# Patient Record
Sex: Female | Born: 1945 | Race: White | Hispanic: No | Smoking: Never smoker
Health system: Southern US, Community
[De-identification: ages and names within clinical notes are randomized; demographics above are authoritative.]

## PROBLEM LIST (undated history)

## (undated) DIAGNOSIS — E079 Disorder of thyroid, unspecified: Secondary | ICD-10-CM

## (undated) DIAGNOSIS — N893 Dysplasia of vagina, unspecified: Secondary | ICD-10-CM

## (undated) DIAGNOSIS — R000 Tachycardia, unspecified: Secondary | ICD-10-CM

## (undated) DIAGNOSIS — I341 Nonrheumatic mitral (valve) prolapse: Secondary | ICD-10-CM

## (undated) DIAGNOSIS — E162 Hypoglycemia, unspecified: Secondary | ICD-10-CM

## (undated) DIAGNOSIS — N952 Postmenopausal atrophic vaginitis: Secondary | ICD-10-CM

## (undated) HISTORY — DX: Tachycardia, unspecified: R00.0

## (undated) HISTORY — DX: Nonrheumatic mitral (valve) prolapse: I34.1

## (undated) HISTORY — DX: Hypoglycemia, unspecified: E16.2

## (undated) HISTORY — PX: ELBOW SURGERY: SHX618

## (undated) HISTORY — DX: Postmenopausal atrophic vaginitis: N95.2

## (undated) HISTORY — PX: OOPHORECTOMY: SHX86

## (undated) HISTORY — DX: Disorder of thyroid, unspecified: E07.9

## (undated) HISTORY — PX: COLPOSCOPY: SHX161

## (undated) HISTORY — DX: Dysplasia of vagina, unspecified: N89.3

## (undated) HISTORY — PX: OTHER SURGICAL HISTORY: SHX169

---

## 1982-11-02 HISTORY — PX: ABDOMINAL HYSTERECTOMY: SHX81

## 1992-11-02 DIAGNOSIS — N893 Dysplasia of vagina, unspecified: Secondary | ICD-10-CM

## 1992-11-02 HISTORY — DX: Dysplasia of vagina, unspecified: N89.3

## 2000-11-02 HISTORY — PX: OTHER SURGICAL HISTORY: SHX169

## 2004-02-26 ENCOUNTER — Other Ambulatory Visit: Admission: RE | Admit: 2004-02-26 | Discharge: 2004-02-26 | Payer: Self-pay | Admitting: Obstetrics and Gynecology

## 2004-03-13 ENCOUNTER — Ambulatory Visit (HOSPITAL_COMMUNITY): Admission: RE | Admit: 2004-03-13 | Discharge: 2004-03-13 | Payer: Self-pay | Admitting: Obstetrics and Gynecology

## 2004-08-14 ENCOUNTER — Other Ambulatory Visit: Admission: RE | Admit: 2004-08-14 | Discharge: 2004-08-14 | Payer: Self-pay | Admitting: Obstetrics and Gynecology

## 2004-11-02 HISTORY — PX: OTHER SURGICAL HISTORY: SHX169

## 2005-03-12 ENCOUNTER — Other Ambulatory Visit: Admission: RE | Admit: 2005-03-12 | Discharge: 2005-03-12 | Payer: Self-pay | Admitting: Addiction Medicine

## 2005-06-21 ENCOUNTER — Emergency Department (HOSPITAL_COMMUNITY): Admission: EM | Admit: 2005-06-21 | Discharge: 2005-06-21 | Payer: Self-pay | Admitting: *Deleted

## 2005-10-16 ENCOUNTER — Ambulatory Visit (HOSPITAL_BASED_OUTPATIENT_CLINIC_OR_DEPARTMENT_OTHER): Admission: RE | Admit: 2005-10-16 | Discharge: 2005-10-16 | Payer: Self-pay | Admitting: Urology

## 2005-10-16 ENCOUNTER — Ambulatory Visit (HOSPITAL_COMMUNITY): Admission: RE | Admit: 2005-10-16 | Discharge: 2005-10-16 | Payer: Self-pay | Admitting: Urology

## 2006-04-06 ENCOUNTER — Other Ambulatory Visit: Admission: RE | Admit: 2006-04-06 | Discharge: 2006-04-06 | Payer: Self-pay | Admitting: Obstetrics and Gynecology

## 2006-08-05 ENCOUNTER — Ambulatory Visit (HOSPITAL_BASED_OUTPATIENT_CLINIC_OR_DEPARTMENT_OTHER): Admission: RE | Admit: 2006-08-05 | Discharge: 2006-08-05 | Payer: Self-pay | Admitting: Urology

## 2007-04-11 ENCOUNTER — Other Ambulatory Visit: Admission: RE | Admit: 2007-04-11 | Discharge: 2007-04-11 | Payer: Self-pay | Admitting: Obstetrics and Gynecology

## 2007-04-18 ENCOUNTER — Encounter (INDEPENDENT_AMBULATORY_CARE_PROVIDER_SITE_OTHER): Payer: Self-pay | Admitting: *Deleted

## 2007-04-18 ENCOUNTER — Ambulatory Visit (HOSPITAL_COMMUNITY): Admission: RE | Admit: 2007-04-18 | Discharge: 2007-04-18 | Payer: Self-pay | Admitting: *Deleted

## 2008-04-30 ENCOUNTER — Ambulatory Visit (HOSPITAL_COMMUNITY): Admission: RE | Admit: 2008-04-30 | Discharge: 2008-04-30 | Payer: Self-pay | Admitting: Obstetrics and Gynecology

## 2008-05-08 ENCOUNTER — Other Ambulatory Visit: Admission: RE | Admit: 2008-05-08 | Discharge: 2008-05-08 | Payer: Self-pay | Admitting: Obstetrics and Gynecology

## 2008-10-19 ENCOUNTER — Ambulatory Visit: Payer: Self-pay | Admitting: Obstetrics and Gynecology

## 2008-10-19 ENCOUNTER — Other Ambulatory Visit: Admission: RE | Admit: 2008-10-19 | Discharge: 2008-10-19 | Payer: Self-pay | Admitting: Obstetrics and Gynecology

## 2008-11-02 HISTORY — PX: CATARACT EXTRACTION: SUR2

## 2009-07-11 ENCOUNTER — Other Ambulatory Visit: Admission: RE | Admit: 2009-07-11 | Discharge: 2009-07-11 | Payer: Self-pay | Admitting: Obstetrics and Gynecology

## 2009-07-11 ENCOUNTER — Ambulatory Visit: Payer: Self-pay | Admitting: Obstetrics and Gynecology

## 2009-07-11 ENCOUNTER — Encounter: Payer: Self-pay | Admitting: Obstetrics and Gynecology

## 2009-07-23 ENCOUNTER — Ambulatory Visit (HOSPITAL_COMMUNITY): Admission: RE | Admit: 2009-07-23 | Discharge: 2009-07-23 | Payer: Self-pay | Admitting: Obstetrics and Gynecology

## 2009-07-23 ENCOUNTER — Ambulatory Visit: Payer: Self-pay | Admitting: Obstetrics and Gynecology

## 2009-07-25 ENCOUNTER — Ambulatory Visit: Payer: Self-pay | Admitting: Obstetrics and Gynecology

## 2009-08-06 ENCOUNTER — Ambulatory Visit: Payer: Self-pay | Admitting: Obstetrics and Gynecology

## 2009-08-22 ENCOUNTER — Ambulatory Visit (HOSPITAL_COMMUNITY): Admission: RE | Admit: 2009-08-22 | Discharge: 2009-08-22 | Payer: Self-pay | Admitting: Obstetrics and Gynecology

## 2009-09-20 ENCOUNTER — Encounter: Admission: RE | Admit: 2009-09-20 | Discharge: 2009-09-20 | Payer: Self-pay | Admitting: Gastroenterology

## 2009-11-02 HISTORY — PX: EYE SURGERY: SHX253

## 2010-01-22 ENCOUNTER — Other Ambulatory Visit: Admission: RE | Admit: 2010-01-22 | Discharge: 2010-01-22 | Payer: Self-pay | Admitting: Obstetrics and Gynecology

## 2010-01-22 ENCOUNTER — Ambulatory Visit: Payer: Self-pay | Admitting: Obstetrics and Gynecology

## 2010-03-03 IMAGING — RF DG ESOPHAGUS
10 series · 10 of 10 positions shown · non-contrast
Comparison: None

CLINICAL DATA: Dysphagia, feeling of food sticking in the mid
esophagus

ESOPHOGRAM/BARIUM SWALLOW
TECHNIQUE: Combined double contrast and single contrast
examination performed using effervescent crystals, thick barium
liquid, and thin barium liquid.
Fluoroscopy time:  2.6 minutes.

[Series 1: run · 1 of 1 slices shown (1 of 10)]
[im 1/1]
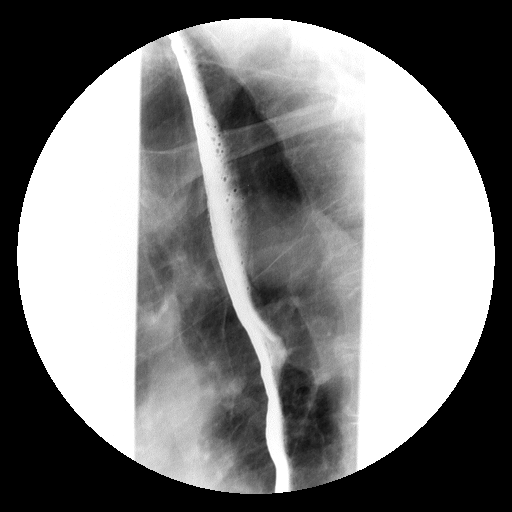

[Series 2: run · 1 of 1 slices shown (2 of 10)]
[im 1/1]
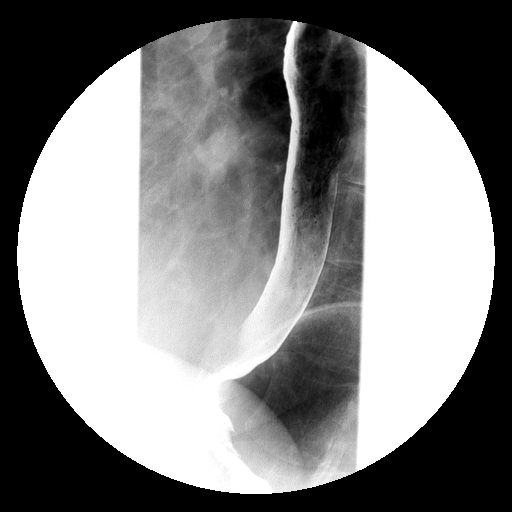

[Series 3: run · 1 of 1 slices shown (3 of 10)]
[im 1/1]
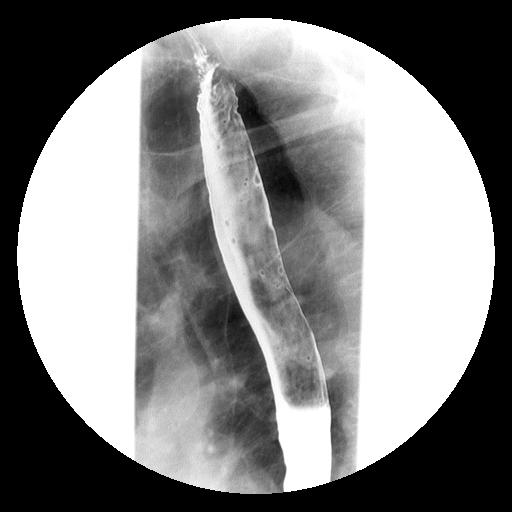

[Series 4: run · 1 of 1 slices shown (4 of 10)]
[im 1/1]
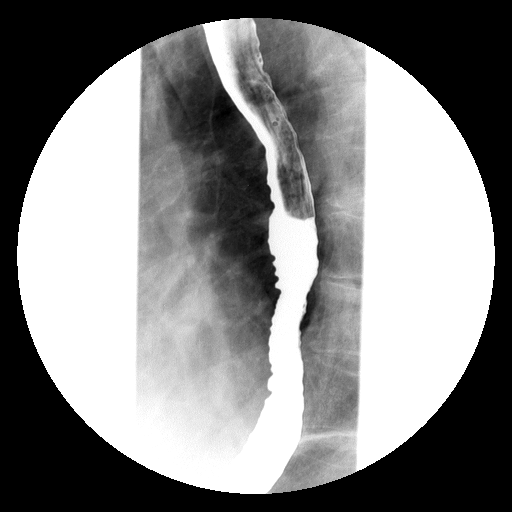

[Series 5: run · 1 of 1 slices shown (5 of 10)]
[im 1/1]
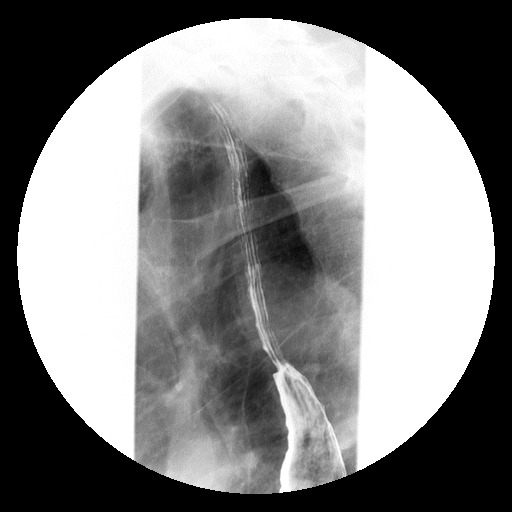

[Series 6: run · 1 of 1 slices shown (6 of 10)]
[im 1/1]
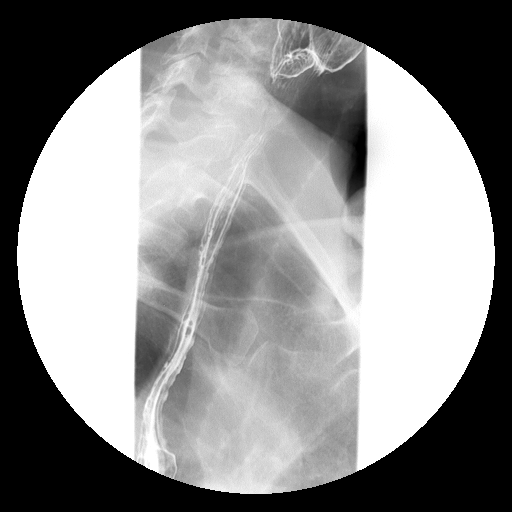

[Series 7: run · 1 of 1 slices shown (7 of 10)]
[im 1/1]
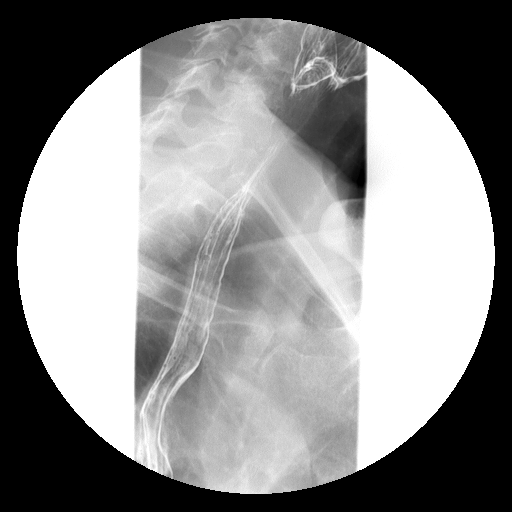

[Series 8: run · 1 of 1 slices shown (8 of 10)]
[im 1/1]
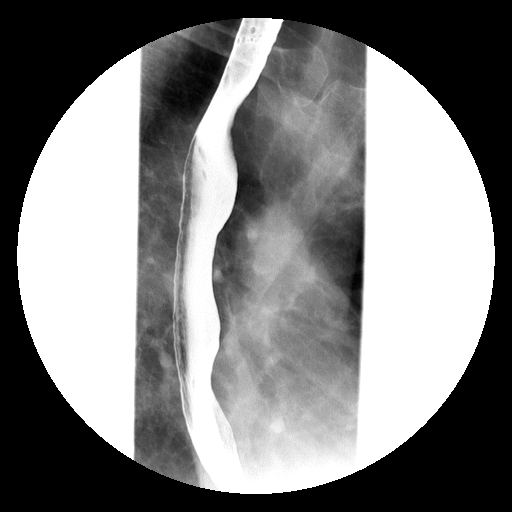

[Series 9: run · 1 of 1 slices shown (9 of 10)]
[im 1/1]
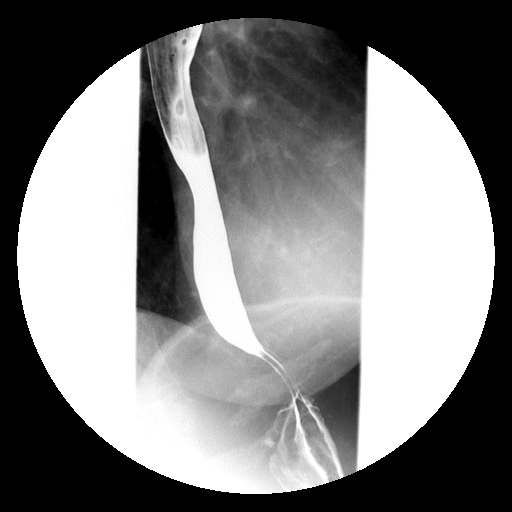

[Series 10: run · 1 of 1 slices shown (10 of 10)]
[im 1/1]
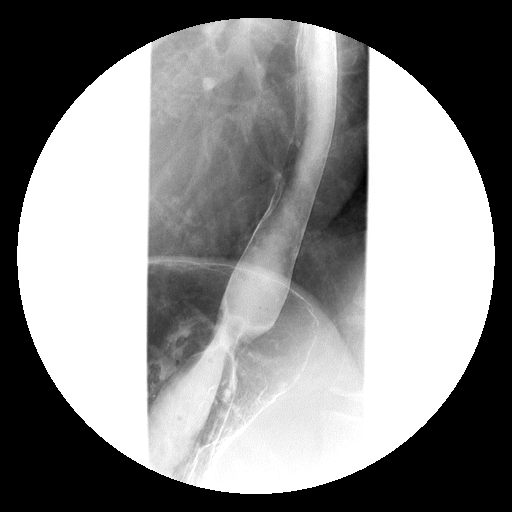

[10 of 10 positions shown; findings below may reference images not displayed]

FINDINGS: No mucosal irregularity within the thoracic esophagus,
distal esophagus, or gastroesophageal junction.  No evidence of
stricture or mass. With the patient in the prone position,
esophageal motility was assessed.  There was escape of the barium
bolus from a primary stripping wave and mild tertiary contractions.
This results in some to-and-fro flow of barium bolus within the
esophagus.

No gastroesophageal reflux is noted during exam.  No hiatal hernia.
A 13 mm barium tablet passed GE junction easily.
IMPRESSION: 1.  No evidence of esophageal mucosal irregularity, stricture, or
mass.
2.  Mild esophageal dysmotility most consistent with
presbyesophagus.

## 2010-08-18 ENCOUNTER — Ambulatory Visit: Payer: Self-pay | Admitting: Obstetrics and Gynecology

## 2010-08-18 ENCOUNTER — Other Ambulatory Visit: Admission: RE | Admit: 2010-08-18 | Discharge: 2010-08-18 | Payer: Self-pay | Admitting: Obstetrics and Gynecology

## 2010-08-25 ENCOUNTER — Ambulatory Visit (HOSPITAL_COMMUNITY): Admission: RE | Admit: 2010-08-25 | Discharge: 2010-08-25 | Payer: Self-pay | Admitting: Obstetrics and Gynecology

## 2011-03-17 NOTE — Op Note (Signed)
NAMELILIA, LETTERMAN               ACCOUNT NO.:  [id number]   MEDICAL RECORD NO.:  [id number]          PATIENT TYPE:  AMB   LOCATION:  DAY                          FACILITY:  [facility]   PHYSICIAN:  Alfonse Ras, MD   DATE OF BIRTH:  Jul 20, 1946   DATE OF PROCEDURE:  04/18/2007  DATE OF DISCHARGE:                               OPERATIVE REPORT   PREOPERATIVE DIAGNOSIS:  Left abdominal wall mass.   POSTOPERATIVE DIAGNOSIS:  Left abdominal wall mass.   PROCEDURE:  Excision of left subcutaneous abdominal wall mass.   ANESTHESIA:  Local MAC.   SURGEON:  Baruch Merl, M.D.   DESCRIPTION:  The patient was taken to the operating room, placed in the  supine position.  After adequate MAC anesthesia was induced, she was  rolled into the right lateral decubitus position.  The left upper  quadrant was prepped and draped in normal sterile fashion.  A  subcutaneous injection of 1% lidocaine with epinephrine was done.  An  elliptical incision was made around the palpable mass.  I dissected down  onto an easily removal and encapsulated lipoma.  This was removed.  The  cavity was coagulated and adequate hemostasis was ensured.  The skin was  closed with a subcuticular #4-0 Monocryl.  Steri-Strips, sterile  dressings were applied.  Patient tolerated the procedure well and went  to PACU in good condition.      Alfonse Ras, MD  Electronically Signed     KRE/MEDQ  D:  04/18/2007  T:  04/18/2007  Job:  [id number]

## 2011-03-20 NOTE — Op Note (Signed)
NAMEJAQUELYN, SAKAMOTO               ACCOUNT NO.:  [id number]   MEDICAL RECORD NO.:  [id number]          PATIENT TYPE:  AMB   LOCATION:  [facility]                         FACILITY:  [facility]   PHYSICIAN:  Martina Sinner, MD DATE OF BIRTH:  Feb 12, 1946   DATE OF PROCEDURE:  08/05/2006  DATE OF DISCHARGE:                                 OPERATIVE REPORT   PREOPERATIVE DIAGNOSIS:  Stress incontinence.   POSTOPERATIVE DIAGNOSIS:  Stress incontinence.   PROCEDURE:  Cystoscopy, transurethral collagen injection therapy.   Ms. Ausley has stress urinary continence.  She was continent after one  collagen.  She has had a previous sling.   The patient was prepped and draped in the usual fashion.  The collagen scope  was used for the examination.  The bladder mucosa and trigone were normal.  I injected 2 syringes at 5 and 7 o'clock.  The 7 o'clock injection worked  Agricultural consultant.  She had excellent coaptation.   The bladder was emptied and she was sent to the recovery room.  She will be  followed as per protocol.           ______________________________  Martina Sinner, MD  Electronically Signed     SAM/MEDQ  D:  08/05/2006  T:  08/07/2006  Job:  [id number]

## 2011-03-20 NOTE — Op Note (Signed)
NAME:  Morgan Wilkinson, Morgan Wilkinson               ACCOUNT NO.:  [id number]   MEDICAL RECORD NO.:  [id number]          PATIENT TYPE:  AMB   LOCATION:  [facility]                         FACILITY:  [facility]   PHYSICIAN:  Martina Sinner, MD DATE OF BIRTH:  Apr 09, 1946   DATE OF PROCEDURE:  DATE OF DISCHARGE:                                 OPERATIVE REPORT   PREOPERATIVE DIAGNOSIS:  Stress incontinence.   POSTOPERATIVE DIAGNOSIS:  Stress incontinence.   SURGERY:  Cystoscopy, transurethral collagen injection therapy.   Ms. Trombetta has stress incontinence and mild urge incontinence.  She is on  Vesicare.  She has had a SPARC sling a few years ago.  She has minimal  hypermobility.   She was prepped and draped in the usual fashion.  The 24 [place] injection  scope was utilized for the cystoscopy and for the injection.  The bladder  mucosa and trigone were normal.  There was no stitch, foreign body, or  carcinoma.  She has a nice healthy, long urethra.  I injected up to 5 and 7  o'clock and had excellent coaptation with two syringes of collagen.  Hopefully, this will greatly improve her continence status.   Her bladder was emptied partially with a 12 French red rubber catheter at  the end of the case.  She was covered perioperatively with antibiotics.  She  will be followed as per protocol.           ______________________________  Martina Sinner, MD  Electronically Signed     SAM/MEDQ  D:  10/16/2005  T:  10/16/2005  Job:  [id number]

## 2011-08-19 LAB — HEMOGLOBIN AND HEMATOCRIT, BLOOD: Hemoglobin: 13.9

## 2011-09-01 ENCOUNTER — Encounter: Payer: Self-pay | Admitting: *Deleted

## 2011-09-01 NOTE — Progress Notes (Signed)
  Lm for patient to call on 08/19/11 and 08/25/11.  Told patient she was due for Reclast and if we were still doing it for her to call.  She had a records release in the chart, but wasn't sure if the patient had left the practice.  Had no return call.  Filed chart back.

## 2011-09-04 ENCOUNTER — Other Ambulatory Visit: Payer: Self-pay | Admitting: Obstetrics and Gynecology

## 2012-02-02 DIAGNOSIS — R000 Tachycardia, unspecified: Secondary | ICD-10-CM | POA: Insufficient documentation

## 2012-02-02 DIAGNOSIS — N809 Endometriosis, unspecified: Secondary | ICD-10-CM | POA: Insufficient documentation

## 2012-02-02 DIAGNOSIS — N952 Postmenopausal atrophic vaginitis: Secondary | ICD-10-CM | POA: Insufficient documentation

## 2012-02-02 DIAGNOSIS — N893 Dysplasia of vagina, unspecified: Secondary | ICD-10-CM | POA: Insufficient documentation

## 2012-02-02 DIAGNOSIS — I341 Nonrheumatic mitral (valve) prolapse: Secondary | ICD-10-CM | POA: Insufficient documentation

## 2012-02-03 ENCOUNTER — Encounter: Payer: Self-pay | Admitting: Obstetrics and Gynecology

## 2012-02-03 ENCOUNTER — Ambulatory Visit (INDEPENDENT_AMBULATORY_CARE_PROVIDER_SITE_OTHER): Payer: Medicare Other | Admitting: Obstetrics and Gynecology

## 2012-02-03 VITALS — BP 138/86 | Ht 64.0 in | Wt 138.0 lb

## 2012-02-03 DIAGNOSIS — N898 Other specified noninflammatory disorders of vagina: Secondary | ICD-10-CM | POA: Diagnosis not present

## 2012-02-03 DIAGNOSIS — N952 Postmenopausal atrophic vaginitis: Secondary | ICD-10-CM

## 2012-02-03 DIAGNOSIS — R350 Frequency of micturition: Secondary | ICD-10-CM

## 2012-02-03 DIAGNOSIS — E079 Disorder of thyroid, unspecified: Secondary | ICD-10-CM | POA: Insufficient documentation

## 2012-02-03 DIAGNOSIS — M810 Age-related osteoporosis without current pathological fracture: Secondary | ICD-10-CM | POA: Insufficient documentation

## 2012-02-03 DIAGNOSIS — N951 Menopausal and female climacteric states: Secondary | ICD-10-CM | POA: Diagnosis not present

## 2012-02-03 DIAGNOSIS — R32 Unspecified urinary incontinence: Secondary | ICD-10-CM | POA: Insufficient documentation

## 2012-02-03 DIAGNOSIS — E162 Hypoglycemia, unspecified: Secondary | ICD-10-CM | POA: Insufficient documentation

## 2012-02-03 DIAGNOSIS — Z780 Asymptomatic menopausal state: Secondary | ICD-10-CM

## 2012-02-03 DIAGNOSIS — J019 Acute sinusitis, unspecified: Secondary | ICD-10-CM

## 2012-02-03 DIAGNOSIS — N893 Dysplasia of vagina, unspecified: Secondary | ICD-10-CM

## 2012-02-03 MED ORDER — AZITHROMYCIN 250 MG PO TABS: ORAL_TABLET | ORAL | Status: AC

## 2012-02-03 MED ORDER — ESTRADIOL 1 MG PO TABS: 2.0000 mg | ORAL_TABLET | Freq: Every day | ORAL | Status: DC

## 2012-02-03 NOTE — Progress Notes (Signed)
Patient came to see me today for further followup. The first thing we discussed is her osteoporosis. She has had two-years of IV Reclast but skipped last year. She has had no fractures. She is ready to proceed with her third year of IV Reclast. 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 She has however now had 3 years of normal Pap smears. She is having no vaginal bleeding. She is having no pelvic pain. She stopped her HRT 2 months ago and after one month of feeling normal is having severe hot flashes and vaginal dryness. She was previously on 1-1/2 mg of estradiol would like to start higher initially to control symptoms. We have discussed estrogen patch which she does not want to do you know she understands the increased risk of DVT with oral estrogen. She also has a sinus infection today with severe sinus headache and purulent discharge and asked me to treat her.  ROS: 12 sister reviewed done. Pertinent positives above. Other positives include tachycardia with mitral valve prolapse, hypoglycemia, and  Hypothyroidism.  HEENT: Within normal limits. Kennon Portela present. Neck: No masses. Supraclavicular lymph nodes: Not enlarged. Breasts: Examined in both sitting and lying position. Symmetrical without skin changes or masses. Abdomen: Soft no masses guarding or rebound. No hernias. Pelvic: External within normal limits. BUS within normal limits. Vaginal examination shows good estrogen effect, no cystocele enterocele or rectocele. Cervix and uterus absent. Adnexa within normal limits. Rectovaginal confirmatory. Extremities within normal limits.  Assessment: Menopausal symptoms. Atrophic vaginitis. Osteoporosis. Sinusitis.  Plan: We started patient on estradiol 2 mg daily. Patient declined estrogen patch. When she is feeling better we will reduce the dose to 1-1/2 mg daily. We will get her approved for IV Reclast. She will continue yearly mammograms. She was prescribed a Z-Pak for her  sinusitis.

## 2012-02-04 DIAGNOSIS — R351 Nocturia: Secondary | ICD-10-CM | POA: Diagnosis not present

## 2012-02-04 DIAGNOSIS — R350 Frequency of micturition: Secondary | ICD-10-CM | POA: Diagnosis not present

## 2012-02-04 DIAGNOSIS — R3915 Urgency of urination: Secondary | ICD-10-CM | POA: Diagnosis not present

## 2012-02-04 LAB — URINALYSIS W MICROSCOPIC + REFLEX CULTURE
Bacteria, UA: NONE SEEN
Bilirubin Urine: NEGATIVE
Crystals: NONE SEEN
Hgb urine dipstick: NEGATIVE
Ketones, ur: NEGATIVE mg/dL
Protein, ur: NEGATIVE mg/dL
Urobilinogen, UA: 0.2 mg/dL (ref 0.0–1.0)

## 2012-02-05 LAB — URINE CULTURE: Colony Count: NO GROWTH

## 2012-02-09 ENCOUNTER — Telehealth: Payer: Self-pay | Admitting: *Deleted

## 2012-02-09 NOTE — Telephone Encounter (Signed)
Message copied by Valeda Malm L on Tue Feb 09, 2012  4:39 PM ------      Message from: Trellis Paganini      Created: Wed Feb 03, 2012  4:52 PM       Please get patient approved for IV Reclast. Diagnosis is osteoporosis.

## 2012-02-09 NOTE — Telephone Encounter (Signed)
Lm for patient to call about benefits for Reclast.

## 2012-02-12 ENCOUNTER — Other Ambulatory Visit: Payer: Self-pay | Admitting: *Deleted

## 2012-02-12 DIAGNOSIS — M898X9 Other specified disorders of bone, unspecified site: Secondary | ICD-10-CM

## 2012-02-12 NOTE — Telephone Encounter (Signed)
Patient informed benefits her part would be approx $347.  Patient wants to proceed.  Will come for labs on Monday.  Orders in pc.

## 2012-02-15 ENCOUNTER — Other Ambulatory Visit: Payer: Medicare Other

## 2012-02-15 DIAGNOSIS — M948X9 Other specified disorders of cartilage, unspecified sites: Secondary | ICD-10-CM | POA: Diagnosis not present

## 2012-02-15 DIAGNOSIS — M898X9 Other specified disorders of bone, unspecified site: Secondary | ICD-10-CM

## 2012-02-15 LAB — CALCIUM: Calcium: 9.7 mg/dL (ref 8.4–10.5)

## 2012-02-17 NOTE — Telephone Encounter (Signed)
Patient informed labs wnl.  Reclast set up for 02/26/12 @ 8am.  Instruction sheet mailed to patient.

## 2012-02-24 ENCOUNTER — Other Ambulatory Visit (HOSPITAL_COMMUNITY): Payer: Self-pay | Admitting: *Deleted

## 2012-02-25 DIAGNOSIS — E039 Hypothyroidism, unspecified: Secondary | ICD-10-CM | POA: Diagnosis not present

## 2012-02-25 DIAGNOSIS — Z79899 Other long term (current) drug therapy: Secondary | ICD-10-CM | POA: Diagnosis not present

## 2012-02-25 DIAGNOSIS — J309 Allergic rhinitis, unspecified: Secondary | ICD-10-CM | POA: Diagnosis not present

## 2012-02-25 DIAGNOSIS — H1045 Other chronic allergic conjunctivitis: Secondary | ICD-10-CM | POA: Diagnosis not present

## 2012-02-26 ENCOUNTER — Encounter (HOSPITAL_COMMUNITY)
Admission: RE | Admit: 2012-02-26 | Discharge: 2012-02-26 | Disposition: A | Discharge status: Home / Self Care | Payer: Medicare Other | Source: Ambulatory Visit | Attending: Obstetrics and Gynecology | Admitting: Obstetrics and Gynecology

## 2012-02-26 DIAGNOSIS — M810 Age-related osteoporosis without current pathological fracture: Secondary | ICD-10-CM | POA: Diagnosis not present

## 2012-02-26 MED ORDER — ZOLEDRONIC ACID 5 MG/100ML IV SOLN
5.0000 mg | Freq: Once | INTRAVENOUS | Status: AC
  Administered 2012-02-26: 5 mg via INTRAVENOUS
  Filled 2012-02-26: qty 100

## 2012-03-10 DIAGNOSIS — K2100 Gastro-esophageal reflux disease with esophagitis, without bleeding: Secondary | ICD-10-CM | POA: Diagnosis not present

## 2012-03-10 DIAGNOSIS — E039 Hypothyroidism, unspecified: Secondary | ICD-10-CM | POA: Diagnosis not present

## 2012-03-21 DIAGNOSIS — R3915 Urgency of urination: Secondary | ICD-10-CM | POA: Diagnosis not present

## 2012-03-21 DIAGNOSIS — R351 Nocturia: Secondary | ICD-10-CM | POA: Diagnosis not present

## 2012-04-05 ENCOUNTER — Telehealth: Payer: Self-pay | Admitting: *Deleted

## 2012-04-05 ENCOUNTER — Other Ambulatory Visit: Payer: Self-pay | Admitting: Family Medicine

## 2012-04-05 DIAGNOSIS — H6120 Impacted cerumen, unspecified ear: Secondary | ICD-10-CM | POA: Diagnosis not present

## 2012-04-05 DIAGNOSIS — Z0000 Encounter for general adult medical examination without abnormal findings: Secondary | ICD-10-CM | POA: Diagnosis not present

## 2012-04-05 DIAGNOSIS — J309 Allergic rhinitis, unspecified: Secondary | ICD-10-CM | POA: Diagnosis not present

## 2012-04-05 DIAGNOSIS — G4700 Insomnia, unspecified: Secondary | ICD-10-CM | POA: Diagnosis not present

## 2012-04-05 DIAGNOSIS — E039 Hypothyroidism, unspecified: Secondary | ICD-10-CM | POA: Diagnosis not present

## 2012-04-05 DIAGNOSIS — F411 Generalized anxiety disorder: Secondary | ICD-10-CM | POA: Diagnosis not present

## 2012-04-05 DIAGNOSIS — Z136 Encounter for screening for cardiovascular disorders: Secondary | ICD-10-CM | POA: Diagnosis not present

## 2012-04-05 DIAGNOSIS — Z79899 Other long term (current) drug therapy: Secondary | ICD-10-CM | POA: Diagnosis not present

## 2012-04-05 DIAGNOSIS — M810 Age-related osteoporosis without current pathological fracture: Secondary | ICD-10-CM | POA: Diagnosis not present

## 2012-04-05 DIAGNOSIS — Z1231 Encounter for screening mammogram for malignant neoplasm of breast: Secondary | ICD-10-CM

## 2012-04-05 MED ORDER — ESTRADIOL 1 MG PO TABS: 2.0000 mg | ORAL_TABLET | Freq: Every day | ORAL | Status: DC

## 2012-04-05 NOTE — Telephone Encounter (Signed)
Pt calling requesting correction on estradiol 1 mg tablet, rx should be estradiol 1 mg take 2 pills po daily # 60, rx sent.

## 2012-04-27 ENCOUNTER — Other Ambulatory Visit: Payer: Self-pay | Admitting: Obstetrics and Gynecology

## 2012-04-27 DIAGNOSIS — M810 Age-related osteoporosis without current pathological fracture: Secondary | ICD-10-CM

## 2012-04-28 ENCOUNTER — Ambulatory Visit (INDEPENDENT_AMBULATORY_CARE_PROVIDER_SITE_OTHER): Payer: Medicare Other

## 2012-04-28 DIAGNOSIS — M810 Age-related osteoporosis without current pathological fracture: Secondary | ICD-10-CM

## 2012-04-28 DIAGNOSIS — M899 Disorder of bone, unspecified: Secondary | ICD-10-CM | POA: Diagnosis not present

## 2012-04-28 DIAGNOSIS — M8580 Other specified disorders of bone density and structure, unspecified site: Secondary | ICD-10-CM

## 2012-04-28 DIAGNOSIS — M949 Disorder of cartilage, unspecified: Secondary | ICD-10-CM

## 2012-05-10 ENCOUNTER — Ambulatory Visit: Payer: Medicare Other

## 2012-05-16 ENCOUNTER — Other Ambulatory Visit (HOSPITAL_COMMUNITY): Payer: Self-pay | Admitting: Family Medicine

## 2012-05-16 ENCOUNTER — Ambulatory Visit (HOSPITAL_COMMUNITY)
Admission: RE | Admit: 2012-05-16 | Discharge: 2012-05-16 | Disposition: A | Discharge status: Home / Self Care | Payer: Medicare Other | Source: Ambulatory Visit | Attending: Family Medicine | Admitting: Family Medicine

## 2012-05-16 ENCOUNTER — Ambulatory Visit: Payer: Medicare Other

## 2012-05-16 DIAGNOSIS — Z1231 Encounter for screening mammogram for malignant neoplasm of breast: Secondary | ICD-10-CM

## 2012-07-07 DIAGNOSIS — M1710 Unilateral primary osteoarthritis, unspecified knee: Secondary | ICD-10-CM | POA: Diagnosis not present

## 2012-07-07 DIAGNOSIS — Z23 Encounter for immunization: Secondary | ICD-10-CM | POA: Diagnosis not present

## 2012-07-07 DIAGNOSIS — E039 Hypothyroidism, unspecified: Secondary | ICD-10-CM | POA: Diagnosis not present

## 2012-07-07 DIAGNOSIS — L259 Unspecified contact dermatitis, unspecified cause: Secondary | ICD-10-CM | POA: Diagnosis not present

## 2012-07-08 DIAGNOSIS — M25569 Pain in unspecified knee: Secondary | ICD-10-CM | POA: Diagnosis not present

## 2012-07-15 DIAGNOSIS — M1710 Unilateral primary osteoarthritis, unspecified knee: Secondary | ICD-10-CM | POA: Diagnosis not present

## 2012-07-15 DIAGNOSIS — IMO0002 Reserved for concepts with insufficient information to code with codable children: Secondary | ICD-10-CM | POA: Diagnosis not present

## 2012-07-15 DIAGNOSIS — M810 Age-related osteoporosis without current pathological fracture: Secondary | ICD-10-CM | POA: Diagnosis not present

## 2012-07-25 DIAGNOSIS — R351 Nocturia: Secondary | ICD-10-CM | POA: Diagnosis not present

## 2012-07-25 DIAGNOSIS — N3946 Mixed incontinence: Secondary | ICD-10-CM | POA: Diagnosis not present

## 2012-08-10 DIAGNOSIS — B351 Tinea unguium: Secondary | ICD-10-CM | POA: Diagnosis not present

## 2012-08-19 DIAGNOSIS — M1710 Unilateral primary osteoarthritis, unspecified knee: Secondary | ICD-10-CM | POA: Diagnosis not present

## 2012-08-19 DIAGNOSIS — B351 Tinea unguium: Secondary | ICD-10-CM | POA: Diagnosis not present

## 2012-08-19 DIAGNOSIS — IMO0002 Reserved for concepts with insufficient information to code with codable children: Secondary | ICD-10-CM | POA: Diagnosis not present

## 2012-08-29 DIAGNOSIS — M898X9 Other specified disorders of bone, unspecified site: Secondary | ICD-10-CM | POA: Diagnosis not present

## 2012-08-29 DIAGNOSIS — L03039 Cellulitis of unspecified toe: Secondary | ICD-10-CM | POA: Diagnosis not present

## 2012-08-29 DIAGNOSIS — M79609 Pain in unspecified limb: Secondary | ICD-10-CM | POA: Diagnosis not present

## 2012-09-05 DIAGNOSIS — M79609 Pain in unspecified limb: Secondary | ICD-10-CM | POA: Diagnosis not present

## 2012-09-05 DIAGNOSIS — L600 Ingrowing nail: Secondary | ICD-10-CM | POA: Diagnosis not present

## 2012-09-06 DIAGNOSIS — Z79899 Other long term (current) drug therapy: Secondary | ICD-10-CM | POA: Diagnosis not present

## 2012-09-06 DIAGNOSIS — B351 Tinea unguium: Secondary | ICD-10-CM | POA: Diagnosis not present

## 2012-09-06 DIAGNOSIS — IMO0002 Reserved for concepts with insufficient information to code with codable children: Secondary | ICD-10-CM | POA: Diagnosis not present

## 2012-09-06 DIAGNOSIS — M1710 Unilateral primary osteoarthritis, unspecified knee: Secondary | ICD-10-CM | POA: Diagnosis not present

## 2012-09-23 DIAGNOSIS — B351 Tinea unguium: Secondary | ICD-10-CM | POA: Diagnosis not present

## 2012-09-23 DIAGNOSIS — M79609 Pain in unspecified limb: Secondary | ICD-10-CM | POA: Diagnosis not present

## 2012-10-21 DIAGNOSIS — M79609 Pain in unspecified limb: Secondary | ICD-10-CM | POA: Diagnosis not present

## 2012-10-21 DIAGNOSIS — B351 Tinea unguium: Secondary | ICD-10-CM | POA: Diagnosis not present

## 2012-11-01 DIAGNOSIS — J329 Chronic sinusitis, unspecified: Secondary | ICD-10-CM | POA: Diagnosis not present

## 2012-11-01 DIAGNOSIS — IMO0002 Reserved for concepts with insufficient information to code with codable children: Secondary | ICD-10-CM | POA: Diagnosis not present

## 2012-11-01 DIAGNOSIS — E039 Hypothyroidism, unspecified: Secondary | ICD-10-CM | POA: Diagnosis not present

## 2012-11-01 DIAGNOSIS — Z79899 Other long term (current) drug therapy: Secondary | ICD-10-CM | POA: Diagnosis not present

## 2012-11-01 DIAGNOSIS — M1710 Unilateral primary osteoarthritis, unspecified knee: Secondary | ICD-10-CM | POA: Diagnosis not present

## 2012-12-07 ENCOUNTER — Other Ambulatory Visit: Payer: Self-pay | Admitting: Obstetrics and Gynecology

## 2013-01-17 DIAGNOSIS — Z79890 Hormone replacement therapy (postmenopausal): Secondary | ICD-10-CM | POA: Diagnosis not present

## 2013-01-17 DIAGNOSIS — IMO0002 Reserved for concepts with insufficient information to code with codable children: Secondary | ICD-10-CM | POA: Diagnosis not present

## 2013-01-17 DIAGNOSIS — M1710 Unilateral primary osteoarthritis, unspecified knee: Secondary | ICD-10-CM | POA: Diagnosis not present

## 2013-01-26 DIAGNOSIS — E039 Hypothyroidism, unspecified: Secondary | ICD-10-CM | POA: Diagnosis not present

## 2013-01-27 DIAGNOSIS — R5381 Other malaise: Secondary | ICD-10-CM | POA: Diagnosis not present

## 2013-01-27 DIAGNOSIS — R5383 Other fatigue: Secondary | ICD-10-CM | POA: Diagnosis not present

## 2013-01-27 DIAGNOSIS — E039 Hypothyroidism, unspecified: Secondary | ICD-10-CM | POA: Diagnosis not present

## 2013-03-16 DIAGNOSIS — M25569 Pain in unspecified knee: Secondary | ICD-10-CM | POA: Diagnosis not present

## 2013-03-16 DIAGNOSIS — M2390 Unspecified internal derangement of unspecified knee: Secondary | ICD-10-CM | POA: Diagnosis not present

## 2013-03-16 DIAGNOSIS — E039 Hypothyroidism, unspecified: Secondary | ICD-10-CM | POA: Diagnosis not present

## 2013-03-16 DIAGNOSIS — M1710 Unilateral primary osteoarthritis, unspecified knee: Secondary | ICD-10-CM | POA: Diagnosis not present

## 2013-03-16 DIAGNOSIS — IMO0002 Reserved for concepts with insufficient information to code with codable children: Secondary | ICD-10-CM | POA: Diagnosis not present

## 2013-03-21 DIAGNOSIS — M23302 Other meniscus derangements, unspecified lateral meniscus, unspecified knee: Secondary | ICD-10-CM | POA: Diagnosis not present

## 2013-03-21 DIAGNOSIS — M2240 Chondromalacia patellae, unspecified knee: Secondary | ICD-10-CM | POA: Diagnosis not present

## 2013-03-24 DIAGNOSIS — S83289A Other tear of lateral meniscus, current injury, unspecified knee, initial encounter: Secondary | ICD-10-CM | POA: Diagnosis not present

## 2013-03-31 DIAGNOSIS — X58XXXA Exposure to other specified factors, initial encounter: Secondary | ICD-10-CM | POA: Diagnosis not present

## 2013-03-31 DIAGNOSIS — Y999 Unspecified external cause status: Secondary | ICD-10-CM | POA: Diagnosis not present

## 2013-03-31 DIAGNOSIS — M1710 Unilateral primary osteoarthritis, unspecified knee: Secondary | ICD-10-CM | POA: Diagnosis not present

## 2013-03-31 DIAGNOSIS — M659 Synovitis and tenosynovitis, unspecified: Secondary | ICD-10-CM | POA: Diagnosis not present

## 2013-03-31 DIAGNOSIS — M2390 Unspecified internal derangement of unspecified knee: Secondary | ICD-10-CM | POA: Diagnosis not present

## 2013-03-31 DIAGNOSIS — IMO0002 Reserved for concepts with insufficient information to code with codable children: Secondary | ICD-10-CM | POA: Diagnosis not present

## 2013-03-31 DIAGNOSIS — S83289A Other tear of lateral meniscus, current injury, unspecified knee, initial encounter: Secondary | ICD-10-CM | POA: Diagnosis not present

## 2013-03-31 DIAGNOSIS — Y929 Unspecified place or not applicable: Secondary | ICD-10-CM | POA: Diagnosis not present

## 2013-03-31 DIAGNOSIS — Y939 Activity, unspecified: Secondary | ICD-10-CM | POA: Diagnosis not present

## 2013-04-14 DIAGNOSIS — M25569 Pain in unspecified knee: Secondary | ICD-10-CM | POA: Diagnosis not present

## 2013-04-19 DIAGNOSIS — M25569 Pain in unspecified knee: Secondary | ICD-10-CM | POA: Diagnosis not present

## 2013-04-20 DIAGNOSIS — M25569 Pain in unspecified knee: Secondary | ICD-10-CM | POA: Diagnosis not present

## 2013-04-25 DIAGNOSIS — M25569 Pain in unspecified knee: Secondary | ICD-10-CM | POA: Diagnosis not present

## 2013-04-27 DIAGNOSIS — M25569 Pain in unspecified knee: Secondary | ICD-10-CM | POA: Diagnosis not present

## 2013-05-01 DIAGNOSIS — M25569 Pain in unspecified knee: Secondary | ICD-10-CM | POA: Diagnosis not present

## 2013-05-04 DIAGNOSIS — M25569 Pain in unspecified knee: Secondary | ICD-10-CM | POA: Diagnosis not present

## 2013-05-08 DIAGNOSIS — M25569 Pain in unspecified knee: Secondary | ICD-10-CM | POA: Diagnosis not present

## 2013-05-10 DIAGNOSIS — M25569 Pain in unspecified knee: Secondary | ICD-10-CM | POA: Diagnosis not present

## 2013-05-16 DIAGNOSIS — M25569 Pain in unspecified knee: Secondary | ICD-10-CM | POA: Diagnosis not present

## 2013-05-18 DIAGNOSIS — M25569 Pain in unspecified knee: Secondary | ICD-10-CM | POA: Diagnosis not present

## 2013-05-22 DIAGNOSIS — M25569 Pain in unspecified knee: Secondary | ICD-10-CM | POA: Diagnosis not present

## 2013-05-25 DIAGNOSIS — M25569 Pain in unspecified knee: Secondary | ICD-10-CM | POA: Diagnosis not present

## 2013-05-29 DIAGNOSIS — E039 Hypothyroidism, unspecified: Secondary | ICD-10-CM | POA: Diagnosis not present

## 2013-05-29 DIAGNOSIS — M1710 Unilateral primary osteoarthritis, unspecified knee: Secondary | ICD-10-CM | POA: Diagnosis not present

## 2013-05-29 DIAGNOSIS — R030 Elevated blood-pressure reading, without diagnosis of hypertension: Secondary | ICD-10-CM | POA: Diagnosis not present

## 2013-05-29 DIAGNOSIS — F330 Major depressive disorder, recurrent, mild: Secondary | ICD-10-CM | POA: Diagnosis not present

## 2013-05-30 DIAGNOSIS — M25569 Pain in unspecified knee: Secondary | ICD-10-CM | POA: Diagnosis not present

## 2013-06-01 DIAGNOSIS — M25569 Pain in unspecified knee: Secondary | ICD-10-CM | POA: Diagnosis not present

## 2013-06-08 DIAGNOSIS — M25569 Pain in unspecified knee: Secondary | ICD-10-CM | POA: Diagnosis not present

## 2013-06-12 DIAGNOSIS — F411 Generalized anxiety disorder: Secondary | ICD-10-CM | POA: Diagnosis not present

## 2013-06-12 DIAGNOSIS — Z01419 Encounter for gynecological examination (general) (routine) without abnormal findings: Secondary | ICD-10-CM | POA: Diagnosis not present

## 2013-06-12 DIAGNOSIS — Z136 Encounter for screening for cardiovascular disorders: Secondary | ICD-10-CM | POA: Diagnosis not present

## 2013-06-12 DIAGNOSIS — Z4889 Encounter for other specified surgical aftercare: Secondary | ICD-10-CM | POA: Diagnosis not present

## 2013-06-12 DIAGNOSIS — I10 Essential (primary) hypertension: Secondary | ICD-10-CM | POA: Diagnosis not present

## 2013-06-20 DIAGNOSIS — R030 Elevated blood-pressure reading, without diagnosis of hypertension: Secondary | ICD-10-CM | POA: Diagnosis not present

## 2013-06-20 DIAGNOSIS — M1710 Unilateral primary osteoarthritis, unspecified knee: Secondary | ICD-10-CM | POA: Diagnosis not present

## 2013-06-20 DIAGNOSIS — F330 Major depressive disorder, recurrent, mild: Secondary | ICD-10-CM | POA: Diagnosis not present

## 2013-07-14 DIAGNOSIS — M1710 Unilateral primary osteoarthritis, unspecified knee: Secondary | ICD-10-CM | POA: Diagnosis not present

## 2013-07-21 DIAGNOSIS — M1710 Unilateral primary osteoarthritis, unspecified knee: Secondary | ICD-10-CM | POA: Diagnosis not present

## 2013-07-28 DIAGNOSIS — M1710 Unilateral primary osteoarthritis, unspecified knee: Secondary | ICD-10-CM | POA: Diagnosis not present

## 2013-08-11 DIAGNOSIS — F330 Major depressive disorder, recurrent, mild: Secondary | ICD-10-CM | POA: Diagnosis not present

## 2013-08-11 DIAGNOSIS — Z23 Encounter for immunization: Secondary | ICD-10-CM | POA: Diagnosis not present

## 2013-08-11 DIAGNOSIS — R030 Elevated blood-pressure reading, without diagnosis of hypertension: Secondary | ICD-10-CM | POA: Diagnosis not present

## 2013-08-11 DIAGNOSIS — Z006 Encounter for examination for normal comparison and control in clinical research program: Secondary | ICD-10-CM | POA: Diagnosis not present

## 2013-08-11 DIAGNOSIS — M1710 Unilateral primary osteoarthritis, unspecified knee: Secondary | ICD-10-CM | POA: Diagnosis not present

## 2013-08-11 DIAGNOSIS — E039 Hypothyroidism, unspecified: Secondary | ICD-10-CM | POA: Diagnosis not present

## 2013-09-19 DIAGNOSIS — M1710 Unilateral primary osteoarthritis, unspecified knee: Secondary | ICD-10-CM | POA: Diagnosis not present

## 2013-11-13 DIAGNOSIS — F330 Major depressive disorder, recurrent, mild: Secondary | ICD-10-CM | POA: Diagnosis not present

## 2013-11-13 DIAGNOSIS — E039 Hypothyroidism, unspecified: Secondary | ICD-10-CM | POA: Diagnosis not present

## 2013-11-13 DIAGNOSIS — M1710 Unilateral primary osteoarthritis, unspecified knee: Secondary | ICD-10-CM | POA: Diagnosis not present

## 2013-11-13 DIAGNOSIS — R030 Elevated blood-pressure reading, without diagnosis of hypertension: Secondary | ICD-10-CM | POA: Diagnosis not present

## 2013-12-15 DIAGNOSIS — IMO0002 Reserved for concepts with insufficient information to code with codable children: Secondary | ICD-10-CM | POA: Diagnosis not present

## 2013-12-15 DIAGNOSIS — M1710 Unilateral primary osteoarthritis, unspecified knee: Secondary | ICD-10-CM | POA: Diagnosis not present

## 2014-01-29 DIAGNOSIS — J3089 Other allergic rhinitis: Secondary | ICD-10-CM | POA: Diagnosis not present

## 2014-01-29 DIAGNOSIS — IMO0002 Reserved for concepts with insufficient information to code with codable children: Secondary | ICD-10-CM | POA: Diagnosis not present

## 2014-01-29 DIAGNOSIS — R030 Elevated blood-pressure reading, without diagnosis of hypertension: Secondary | ICD-10-CM | POA: Diagnosis not present

## 2014-01-29 DIAGNOSIS — E039 Hypothyroidism, unspecified: Secondary | ICD-10-CM | POA: Diagnosis not present

## 2014-01-29 DIAGNOSIS — L2089 Other atopic dermatitis: Secondary | ICD-10-CM | POA: Diagnosis not present

## 2014-01-29 DIAGNOSIS — M1710 Unilateral primary osteoarthritis, unspecified knee: Secondary | ICD-10-CM | POA: Diagnosis not present

## 2014-02-06 DIAGNOSIS — M1710 Unilateral primary osteoarthritis, unspecified knee: Secondary | ICD-10-CM | POA: Diagnosis not present

## 2014-02-06 DIAGNOSIS — IMO0002 Reserved for concepts with insufficient information to code with codable children: Secondary | ICD-10-CM | POA: Diagnosis not present

## 2014-02-12 DIAGNOSIS — M1710 Unilateral primary osteoarthritis, unspecified knee: Secondary | ICD-10-CM | POA: Diagnosis not present

## 2014-02-12 DIAGNOSIS — I10 Essential (primary) hypertension: Secondary | ICD-10-CM | POA: Diagnosis not present

## 2014-02-12 DIAGNOSIS — IMO0002 Reserved for concepts with insufficient information to code with codable children: Secondary | ICD-10-CM | POA: Diagnosis not present

## 2014-03-07 DIAGNOSIS — L738 Other specified follicular disorders: Secondary | ICD-10-CM | POA: Diagnosis not present

## 2014-03-27 DIAGNOSIS — J3089 Other allergic rhinitis: Secondary | ICD-10-CM | POA: Diagnosis not present

## 2014-03-27 DIAGNOSIS — M1710 Unilateral primary osteoarthritis, unspecified knee: Secondary | ICD-10-CM | POA: Diagnosis not present

## 2014-03-27 DIAGNOSIS — J209 Acute bronchitis, unspecified: Secondary | ICD-10-CM | POA: Diagnosis not present

## 2014-04-05 DIAGNOSIS — N3946 Mixed incontinence: Secondary | ICD-10-CM | POA: Diagnosis not present

## 2014-04-09 DIAGNOSIS — L821 Other seborrheic keratosis: Secondary | ICD-10-CM | POA: Diagnosis not present

## 2014-04-09 DIAGNOSIS — D692 Other nonthrombocytopenic purpura: Secondary | ICD-10-CM | POA: Diagnosis not present

## 2014-04-11 DIAGNOSIS — R05 Cough: Secondary | ICD-10-CM | POA: Diagnosis not present

## 2014-04-11 DIAGNOSIS — J449 Chronic obstructive pulmonary disease, unspecified: Secondary | ICD-10-CM | POA: Diagnosis not present

## 2014-04-11 DIAGNOSIS — H6120 Impacted cerumen, unspecified ear: Secondary | ICD-10-CM | POA: Diagnosis not present

## 2014-04-11 DIAGNOSIS — R0602 Shortness of breath: Secondary | ICD-10-CM | POA: Diagnosis not present

## 2014-04-11 DIAGNOSIS — R059 Cough, unspecified: Secondary | ICD-10-CM | POA: Diagnosis not present

## 2014-04-17 ENCOUNTER — Ambulatory Visit
Admission: RE | Admit: 2014-04-17 | Discharge: 2014-04-17 | Disposition: A | Discharge status: Home / Self Care | Payer: Medicare Other | Source: Ambulatory Visit | Attending: Family Medicine | Admitting: Family Medicine

## 2014-04-17 ENCOUNTER — Other Ambulatory Visit: Payer: Self-pay | Admitting: Family Medicine

## 2014-04-17 DIAGNOSIS — R0602 Shortness of breath: Secondary | ICD-10-CM

## 2014-04-17 DIAGNOSIS — R079 Chest pain, unspecified: Secondary | ICD-10-CM | POA: Diagnosis not present

## 2014-04-17 DIAGNOSIS — J449 Chronic obstructive pulmonary disease, unspecified: Secondary | ICD-10-CM | POA: Diagnosis not present

## 2014-04-17 DIAGNOSIS — R059 Cough, unspecified: Secondary | ICD-10-CM | POA: Diagnosis not present

## 2014-04-17 DIAGNOSIS — R05 Cough: Secondary | ICD-10-CM

## 2014-04-17 DIAGNOSIS — J45901 Unspecified asthma with (acute) exacerbation: Secondary | ICD-10-CM | POA: Diagnosis not present

## 2014-05-08 DIAGNOSIS — L258 Unspecified contact dermatitis due to other agents: Secondary | ICD-10-CM | POA: Diagnosis not present

## 2014-05-08 DIAGNOSIS — IMO0002 Reserved for concepts with insufficient information to code with codable children: Secondary | ICD-10-CM | POA: Diagnosis not present

## 2014-05-08 DIAGNOSIS — J309 Allergic rhinitis, unspecified: Secondary | ICD-10-CM | POA: Diagnosis not present

## 2014-05-08 DIAGNOSIS — M1710 Unilateral primary osteoarthritis, unspecified knee: Secondary | ICD-10-CM | POA: Diagnosis not present

## 2014-05-08 DIAGNOSIS — J449 Chronic obstructive pulmonary disease, unspecified: Secondary | ICD-10-CM | POA: Diagnosis not present

## 2014-05-16 DIAGNOSIS — N3946 Mixed incontinence: Secondary | ICD-10-CM | POA: Diagnosis not present

## 2014-05-16 DIAGNOSIS — L600 Ingrowing nail: Secondary | ICD-10-CM | POA: Diagnosis not present

## 2014-05-16 DIAGNOSIS — M79609 Pain in unspecified limb: Secondary | ICD-10-CM | POA: Diagnosis not present

## 2014-05-16 DIAGNOSIS — R350 Frequency of micturition: Secondary | ICD-10-CM | POA: Diagnosis not present

## 2014-05-17 DIAGNOSIS — N3946 Mixed incontinence: Secondary | ICD-10-CM | POA: Diagnosis not present

## 2014-05-28 DIAGNOSIS — M1710 Unilateral primary osteoarthritis, unspecified knee: Secondary | ICD-10-CM | POA: Diagnosis not present

## 2014-06-12 DIAGNOSIS — J309 Allergic rhinitis, unspecified: Secondary | ICD-10-CM | POA: Diagnosis not present

## 2014-06-12 DIAGNOSIS — L258 Unspecified contact dermatitis due to other agents: Secondary | ICD-10-CM | POA: Diagnosis not present

## 2014-06-12 DIAGNOSIS — J449 Chronic obstructive pulmonary disease, unspecified: Secondary | ICD-10-CM | POA: Diagnosis not present

## 2014-06-12 DIAGNOSIS — M1710 Unilateral primary osteoarthritis, unspecified knee: Secondary | ICD-10-CM | POA: Diagnosis not present

## 2014-06-12 DIAGNOSIS — T8189XA Other complications of procedures, not elsewhere classified, initial encounter: Secondary | ICD-10-CM | POA: Diagnosis not present

## 2014-06-12 DIAGNOSIS — M79609 Pain in unspecified limb: Secondary | ICD-10-CM | POA: Diagnosis not present

## 2014-06-13 DIAGNOSIS — M62838 Other muscle spasm: Secondary | ICD-10-CM | POA: Diagnosis not present

## 2014-06-13 DIAGNOSIS — R279 Unspecified lack of coordination: Secondary | ICD-10-CM | POA: Diagnosis not present

## 2014-06-13 DIAGNOSIS — M6281 Muscle weakness (generalized): Secondary | ICD-10-CM | POA: Diagnosis not present

## 2014-06-13 DIAGNOSIS — R350 Frequency of micturition: Secondary | ICD-10-CM | POA: Diagnosis not present

## 2014-06-13 DIAGNOSIS — N3946 Mixed incontinence: Secondary | ICD-10-CM | POA: Diagnosis not present

## 2014-06-19 DIAGNOSIS — R0602 Shortness of breath: Secondary | ICD-10-CM | POA: Diagnosis not present

## 2014-06-19 DIAGNOSIS — J449 Chronic obstructive pulmonary disease, unspecified: Secondary | ICD-10-CM | POA: Diagnosis not present

## 2014-06-19 DIAGNOSIS — N951 Menopausal and female climacteric states: Secondary | ICD-10-CM | POA: Diagnosis not present

## 2014-06-19 DIAGNOSIS — R350 Frequency of micturition: Secondary | ICD-10-CM | POA: Diagnosis not present

## 2014-06-19 DIAGNOSIS — K219 Gastro-esophageal reflux disease without esophagitis: Secondary | ICD-10-CM | POA: Diagnosis not present

## 2014-06-19 DIAGNOSIS — T8189XA Other complications of procedures, not elsewhere classified, initial encounter: Secondary | ICD-10-CM | POA: Diagnosis not present

## 2014-06-19 DIAGNOSIS — M1710 Unilateral primary osteoarthritis, unspecified knee: Secondary | ICD-10-CM | POA: Diagnosis not present

## 2014-06-19 DIAGNOSIS — N3946 Mixed incontinence: Secondary | ICD-10-CM | POA: Diagnosis not present

## 2014-06-26 DIAGNOSIS — M1710 Unilateral primary osteoarthritis, unspecified knee: Secondary | ICD-10-CM | POA: Diagnosis not present

## 2014-07-03 DIAGNOSIS — K219 Gastro-esophageal reflux disease without esophagitis: Secondary | ICD-10-CM | POA: Diagnosis not present

## 2014-07-03 DIAGNOSIS — T8189XA Other complications of procedures, not elsewhere classified, initial encounter: Secondary | ICD-10-CM | POA: Diagnosis not present

## 2014-07-03 DIAGNOSIS — M1710 Unilateral primary osteoarthritis, unspecified knee: Secondary | ICD-10-CM | POA: Diagnosis not present

## 2014-07-03 DIAGNOSIS — Z01419 Encounter for gynecological examination (general) (routine) without abnormal findings: Secondary | ICD-10-CM | POA: Diagnosis not present

## 2014-07-03 DIAGNOSIS — M810 Age-related osteoporosis without current pathological fracture: Secondary | ICD-10-CM | POA: Diagnosis not present

## 2014-07-03 DIAGNOSIS — E039 Hypothyroidism, unspecified: Secondary | ICD-10-CM | POA: Diagnosis not present

## 2014-07-03 DIAGNOSIS — Z1211 Encounter for screening for malignant neoplasm of colon: Secondary | ICD-10-CM | POA: Diagnosis not present

## 2014-07-03 DIAGNOSIS — Z136 Encounter for screening for cardiovascular disorders: Secondary | ICD-10-CM | POA: Diagnosis not present

## 2014-07-03 DIAGNOSIS — Z0000 Encounter for general adult medical examination without abnormal findings: Secondary | ICD-10-CM | POA: Diagnosis not present

## 2014-07-03 DIAGNOSIS — Z23 Encounter for immunization: Secondary | ICD-10-CM | POA: Diagnosis not present

## 2014-07-10 ENCOUNTER — Other Ambulatory Visit (HOSPITAL_COMMUNITY): Payer: Self-pay | Admitting: Family Medicine

## 2014-07-10 DIAGNOSIS — Z1231 Encounter for screening mammogram for malignant neoplasm of breast: Secondary | ICD-10-CM

## 2014-07-10 DIAGNOSIS — M810 Age-related osteoporosis without current pathological fracture: Secondary | ICD-10-CM

## 2014-07-14 DIAGNOSIS — M1710 Unilateral primary osteoarthritis, unspecified knee: Secondary | ICD-10-CM | POA: Diagnosis not present

## 2014-07-17 ENCOUNTER — Ambulatory Visit (HOSPITAL_COMMUNITY)
Admission: RE | Admit: 2014-07-17 | Discharge: 2014-07-17 | Disposition: A | Discharge status: Home / Self Care | Payer: Medicare Other | Source: Ambulatory Visit | Attending: Family Medicine | Admitting: Family Medicine

## 2014-07-17 DIAGNOSIS — Z13820 Encounter for screening for osteoporosis: Secondary | ICD-10-CM | POA: Diagnosis not present

## 2014-07-17 DIAGNOSIS — Z780 Asymptomatic menopausal state: Secondary | ICD-10-CM | POA: Diagnosis not present

## 2014-07-17 DIAGNOSIS — M810 Age-related osteoporosis without current pathological fracture: Secondary | ICD-10-CM

## 2014-07-17 DIAGNOSIS — Z1231 Encounter for screening mammogram for malignant neoplasm of breast: Secondary | ICD-10-CM

## 2014-07-17 DIAGNOSIS — M899 Disorder of bone, unspecified: Secondary | ICD-10-CM | POA: Diagnosis not present

## 2014-07-17 DIAGNOSIS — M949 Disorder of cartilage, unspecified: Secondary | ICD-10-CM | POA: Diagnosis not present

## 2014-08-14 DIAGNOSIS — L821 Other seborrheic keratosis: Secondary | ICD-10-CM | POA: Diagnosis not present

## 2014-08-14 DIAGNOSIS — Z23 Encounter for immunization: Secondary | ICD-10-CM | POA: Diagnosis not present

## 2014-08-14 DIAGNOSIS — M1712 Unilateral primary osteoarthritis, left knee: Secondary | ICD-10-CM | POA: Diagnosis not present

## 2014-08-14 DIAGNOSIS — L570 Actinic keratosis: Secondary | ICD-10-CM | POA: Diagnosis not present

## 2014-08-27 DIAGNOSIS — M25562 Pain in left knee: Secondary | ICD-10-CM | POA: Diagnosis not present

## 2014-08-27 DIAGNOSIS — M1712 Unilateral primary osteoarthritis, left knee: Secondary | ICD-10-CM | POA: Diagnosis not present

## 2014-09-03 ENCOUNTER — Encounter: Payer: Self-pay | Admitting: Obstetrics and Gynecology

## 2014-09-03 DIAGNOSIS — Z96652 Presence of left artificial knee joint: Secondary | ICD-10-CM | POA: Diagnosis not present

## 2014-09-03 DIAGNOSIS — M1990 Unspecified osteoarthritis, unspecified site: Secondary | ICD-10-CM | POA: Diagnosis not present

## 2014-09-03 DIAGNOSIS — Z471 Aftercare following joint replacement surgery: Secondary | ICD-10-CM | POA: Diagnosis not present

## 2014-09-03 DIAGNOSIS — M1712 Unilateral primary osteoarthritis, left knee: Secondary | ICD-10-CM | POA: Diagnosis not present

## 2014-09-03 DIAGNOSIS — M179 Osteoarthritis of knee, unspecified: Secondary | ICD-10-CM | POA: Diagnosis not present

## 2014-09-03 DIAGNOSIS — M1711 Unilateral primary osteoarthritis, right knee: Secondary | ICD-10-CM | POA: Diagnosis not present

## 2014-09-13 DIAGNOSIS — Z96652 Presence of left artificial knee joint: Secondary | ICD-10-CM | POA: Diagnosis not present

## 2014-09-13 DIAGNOSIS — Z471 Aftercare following joint replacement surgery: Secondary | ICD-10-CM | POA: Diagnosis not present

## 2014-09-14 DIAGNOSIS — Z96652 Presence of left artificial knee joint: Secondary | ICD-10-CM | POA: Diagnosis not present

## 2014-09-14 DIAGNOSIS — Z471 Aftercare following joint replacement surgery: Secondary | ICD-10-CM | POA: Diagnosis not present

## 2014-09-17 DIAGNOSIS — Z471 Aftercare following joint replacement surgery: Secondary | ICD-10-CM | POA: Diagnosis not present

## 2014-09-17 DIAGNOSIS — Z96652 Presence of left artificial knee joint: Secondary | ICD-10-CM | POA: Diagnosis not present

## 2014-09-18 DIAGNOSIS — Z96652 Presence of left artificial knee joint: Secondary | ICD-10-CM | POA: Diagnosis not present

## 2014-09-18 DIAGNOSIS — Z471 Aftercare following joint replacement surgery: Secondary | ICD-10-CM | POA: Diagnosis not present

## 2014-09-19 DIAGNOSIS — Z96652 Presence of left artificial knee joint: Secondary | ICD-10-CM | POA: Diagnosis not present

## 2014-09-19 DIAGNOSIS — Z471 Aftercare following joint replacement surgery: Secondary | ICD-10-CM | POA: Diagnosis not present

## 2014-09-21 DIAGNOSIS — Z96652 Presence of left artificial knee joint: Secondary | ICD-10-CM | POA: Diagnosis not present

## 2014-09-21 DIAGNOSIS — Z471 Aftercare following joint replacement surgery: Secondary | ICD-10-CM | POA: Diagnosis not present

## 2014-09-24 DIAGNOSIS — Z96652 Presence of left artificial knee joint: Secondary | ICD-10-CM | POA: Diagnosis not present

## 2014-09-24 DIAGNOSIS — Z471 Aftercare following joint replacement surgery: Secondary | ICD-10-CM | POA: Diagnosis not present

## 2014-09-25 DIAGNOSIS — Z96652 Presence of left artificial knee joint: Secondary | ICD-10-CM | POA: Diagnosis not present

## 2014-09-25 DIAGNOSIS — Z471 Aftercare following joint replacement surgery: Secondary | ICD-10-CM | POA: Diagnosis not present

## 2014-09-26 DIAGNOSIS — Z471 Aftercare following joint replacement surgery: Secondary | ICD-10-CM | POA: Diagnosis not present

## 2014-09-26 DIAGNOSIS — Z96652 Presence of left artificial knee joint: Secondary | ICD-10-CM | POA: Diagnosis not present

## 2014-10-01 DIAGNOSIS — Z96652 Presence of left artificial knee joint: Secondary | ICD-10-CM | POA: Diagnosis not present

## 2014-10-01 DIAGNOSIS — Z471 Aftercare following joint replacement surgery: Secondary | ICD-10-CM | POA: Diagnosis not present

## 2014-10-02 DIAGNOSIS — Z96652 Presence of left artificial knee joint: Secondary | ICD-10-CM | POA: Diagnosis not present

## 2014-10-02 DIAGNOSIS — Z471 Aftercare following joint replacement surgery: Secondary | ICD-10-CM | POA: Diagnosis not present

## 2014-10-03 DIAGNOSIS — Z471 Aftercare following joint replacement surgery: Secondary | ICD-10-CM | POA: Diagnosis not present

## 2014-10-03 DIAGNOSIS — Z96652 Presence of left artificial knee joint: Secondary | ICD-10-CM | POA: Diagnosis not present

## 2014-10-04 DIAGNOSIS — Z471 Aftercare following joint replacement surgery: Secondary | ICD-10-CM | POA: Diagnosis not present

## 2014-10-04 DIAGNOSIS — Z96652 Presence of left artificial knee joint: Secondary | ICD-10-CM | POA: Diagnosis not present

## 2014-10-08 DIAGNOSIS — M1712 Unilateral primary osteoarthritis, left knee: Secondary | ICD-10-CM | POA: Diagnosis not present

## 2014-10-08 DIAGNOSIS — T8189XA Other complications of procedures, not elsewhere classified, initial encounter: Secondary | ICD-10-CM | POA: Diagnosis not present

## 2014-10-08 DIAGNOSIS — Z471 Aftercare following joint replacement surgery: Secondary | ICD-10-CM | POA: Diagnosis not present

## 2014-10-08 DIAGNOSIS — Z96652 Presence of left artificial knee joint: Secondary | ICD-10-CM | POA: Diagnosis not present

## 2014-10-11 DIAGNOSIS — Z96652 Presence of left artificial knee joint: Secondary | ICD-10-CM | POA: Diagnosis not present

## 2014-10-11 DIAGNOSIS — M25562 Pain in left knee: Secondary | ICD-10-CM | POA: Diagnosis not present

## 2014-10-15 DIAGNOSIS — Z96652 Presence of left artificial knee joint: Secondary | ICD-10-CM | POA: Diagnosis not present

## 2014-10-15 DIAGNOSIS — M25562 Pain in left knee: Secondary | ICD-10-CM | POA: Diagnosis not present

## 2014-10-18 DIAGNOSIS — Z96652 Presence of left artificial knee joint: Secondary | ICD-10-CM | POA: Diagnosis not present

## 2014-10-18 DIAGNOSIS — M25562 Pain in left knee: Secondary | ICD-10-CM | POA: Diagnosis not present

## 2014-10-19 DIAGNOSIS — M25562 Pain in left knee: Secondary | ICD-10-CM | POA: Diagnosis not present

## 2014-10-19 DIAGNOSIS — Z96652 Presence of left artificial knee joint: Secondary | ICD-10-CM | POA: Diagnosis not present

## 2014-10-22 DIAGNOSIS — Z96652 Presence of left artificial knee joint: Secondary | ICD-10-CM | POA: Diagnosis not present

## 2014-10-22 DIAGNOSIS — M25562 Pain in left knee: Secondary | ICD-10-CM | POA: Diagnosis not present

## 2014-10-24 DIAGNOSIS — M25562 Pain in left knee: Secondary | ICD-10-CM | POA: Diagnosis not present

## 2014-10-24 DIAGNOSIS — Z96652 Presence of left artificial knee joint: Secondary | ICD-10-CM | POA: Diagnosis not present

## 2014-10-29 DIAGNOSIS — M25562 Pain in left knee: Secondary | ICD-10-CM | POA: Diagnosis not present

## 2014-10-29 DIAGNOSIS — Z96652 Presence of left artificial knee joint: Secondary | ICD-10-CM | POA: Diagnosis not present

## 2014-10-31 DIAGNOSIS — M25562 Pain in left knee: Secondary | ICD-10-CM | POA: Diagnosis not present

## 2014-10-31 DIAGNOSIS — Z96652 Presence of left artificial knee joint: Secondary | ICD-10-CM | POA: Diagnosis not present

## 2014-11-01 DIAGNOSIS — M25562 Pain in left knee: Secondary | ICD-10-CM | POA: Diagnosis not present

## 2014-11-01 DIAGNOSIS — Z96652 Presence of left artificial knee joint: Secondary | ICD-10-CM | POA: Diagnosis not present

## 2014-11-05 DIAGNOSIS — M25562 Pain in left knee: Secondary | ICD-10-CM | POA: Diagnosis not present

## 2014-11-05 DIAGNOSIS — Z96652 Presence of left artificial knee joint: Secondary | ICD-10-CM | POA: Diagnosis not present

## 2014-11-07 DIAGNOSIS — Z96652 Presence of left artificial knee joint: Secondary | ICD-10-CM | POA: Diagnosis not present

## 2014-11-07 DIAGNOSIS — M25562 Pain in left knee: Secondary | ICD-10-CM | POA: Diagnosis not present

## 2014-11-09 DIAGNOSIS — M25562 Pain in left knee: Secondary | ICD-10-CM | POA: Diagnosis not present

## 2014-11-09 DIAGNOSIS — Z96652 Presence of left artificial knee joint: Secondary | ICD-10-CM | POA: Diagnosis not present

## 2014-11-12 DIAGNOSIS — Z96652 Presence of left artificial knee joint: Secondary | ICD-10-CM | POA: Diagnosis not present

## 2014-11-12 DIAGNOSIS — M25562 Pain in left knee: Secondary | ICD-10-CM | POA: Diagnosis not present

## 2014-11-14 DIAGNOSIS — M25562 Pain in left knee: Secondary | ICD-10-CM | POA: Diagnosis not present

## 2014-11-14 DIAGNOSIS — Z96652 Presence of left artificial knee joint: Secondary | ICD-10-CM | POA: Diagnosis not present

## 2014-11-16 DIAGNOSIS — Z96652 Presence of left artificial knee joint: Secondary | ICD-10-CM | POA: Diagnosis not present

## 2014-11-16 DIAGNOSIS — M25562 Pain in left knee: Secondary | ICD-10-CM | POA: Diagnosis not present

## 2014-11-19 DIAGNOSIS — Z96652 Presence of left artificial knee joint: Secondary | ICD-10-CM | POA: Diagnosis not present

## 2014-11-19 DIAGNOSIS — M25562 Pain in left knee: Secondary | ICD-10-CM | POA: Diagnosis not present

## 2014-11-21 DIAGNOSIS — M25562 Pain in left knee: Secondary | ICD-10-CM | POA: Diagnosis not present

## 2014-11-21 DIAGNOSIS — Z96652 Presence of left artificial knee joint: Secondary | ICD-10-CM | POA: Diagnosis not present

## 2014-11-23 DIAGNOSIS — M25562 Pain in left knee: Secondary | ICD-10-CM | POA: Diagnosis not present

## 2014-11-23 DIAGNOSIS — Z96652 Presence of left artificial knee joint: Secondary | ICD-10-CM | POA: Diagnosis not present

## 2014-12-14 DIAGNOSIS — M1712 Unilateral primary osteoarthritis, left knee: Secondary | ICD-10-CM | POA: Diagnosis not present

## 2015-01-16 DIAGNOSIS — S61219A Laceration without foreign body of unspecified finger without damage to nail, initial encounter: Secondary | ICD-10-CM | POA: Diagnosis not present

## 2015-01-29 DIAGNOSIS — E038 Other specified hypothyroidism: Secondary | ICD-10-CM | POA: Diagnosis not present

## 2015-01-29 DIAGNOSIS — N393 Stress incontinence (female) (male): Secondary | ICD-10-CM | POA: Diagnosis not present

## 2015-01-30 DIAGNOSIS — E039 Hypothyroidism, unspecified: Secondary | ICD-10-CM | POA: Diagnosis not present

## 2015-01-30 DIAGNOSIS — E038 Other specified hypothyroidism: Secondary | ICD-10-CM | POA: Diagnosis not present

## 2015-01-30 DIAGNOSIS — E018 Other iodine-deficiency related thyroid disorders and allied conditions: Secondary | ICD-10-CM | POA: Diagnosis not present

## 2015-03-07 DIAGNOSIS — N3281 Overactive bladder: Secondary | ICD-10-CM | POA: Diagnosis not present

## 2015-03-20 DIAGNOSIS — N952 Postmenopausal atrophic vaginitis: Secondary | ICD-10-CM | POA: Diagnosis not present

## 2015-03-20 DIAGNOSIS — N3946 Mixed incontinence: Secondary | ICD-10-CM | POA: Diagnosis not present

## 2015-03-20 DIAGNOSIS — R350 Frequency of micturition: Secondary | ICD-10-CM | POA: Diagnosis not present

## 2015-04-02 DIAGNOSIS — R351 Nocturia: Secondary | ICD-10-CM | POA: Diagnosis not present

## 2015-04-02 DIAGNOSIS — N3281 Overactive bladder: Secondary | ICD-10-CM | POA: Diagnosis not present

## 2015-04-02 DIAGNOSIS — Z01812 Encounter for preprocedural laboratory examination: Secondary | ICD-10-CM | POA: Diagnosis not present

## 2015-04-02 DIAGNOSIS — Z01810 Encounter for preprocedural cardiovascular examination: Secondary | ICD-10-CM | POA: Diagnosis not present

## 2015-04-02 DIAGNOSIS — N393 Stress incontinence (female) (male): Secondary | ICD-10-CM | POA: Diagnosis not present

## 2015-04-02 DIAGNOSIS — N3642 Intrinsic sphincter deficiency (ISD): Secondary | ICD-10-CM | POA: Diagnosis not present

## 2015-05-09 DIAGNOSIS — Z01812 Encounter for preprocedural laboratory examination: Secondary | ICD-10-CM | POA: Diagnosis not present

## 2015-05-09 DIAGNOSIS — N320 Bladder-neck obstruction: Secondary | ICD-10-CM | POA: Diagnosis not present

## 2015-05-09 DIAGNOSIS — N3281 Overactive bladder: Secondary | ICD-10-CM | POA: Diagnosis not present

## 2015-05-09 DIAGNOSIS — R351 Nocturia: Secondary | ICD-10-CM | POA: Diagnosis not present

## 2015-05-09 DIAGNOSIS — N393 Stress incontinence (female) (male): Secondary | ICD-10-CM | POA: Diagnosis not present

## 2015-05-09 DIAGNOSIS — Z01810 Encounter for preprocedural cardiovascular examination: Secondary | ICD-10-CM | POA: Diagnosis not present

## 2015-05-09 DIAGNOSIS — N3642 Intrinsic sphincter deficiency (ISD): Secondary | ICD-10-CM | POA: Diagnosis not present

## 2015-05-17 DIAGNOSIS — L02415 Cutaneous abscess of right lower limb: Secondary | ICD-10-CM | POA: Diagnosis not present

## 2015-05-28 DIAGNOSIS — R350 Frequency of micturition: Secondary | ICD-10-CM | POA: Diagnosis not present

## 2015-05-28 DIAGNOSIS — R3915 Urgency of urination: Secondary | ICD-10-CM | POA: Diagnosis not present

## 2015-06-03 DIAGNOSIS — S90851A Superficial foreign body, right foot, initial encounter: Secondary | ICD-10-CM | POA: Diagnosis not present

## 2015-06-03 DIAGNOSIS — L02611 Cutaneous abscess of right foot: Secondary | ICD-10-CM | POA: Diagnosis not present

## 2015-06-18 DIAGNOSIS — N3946 Mixed incontinence: Secondary | ICD-10-CM | POA: Diagnosis not present

## 2015-06-18 DIAGNOSIS — N393 Stress incontinence (female) (male): Secondary | ICD-10-CM | POA: Diagnosis not present

## 2015-06-18 DIAGNOSIS — N952 Postmenopausal atrophic vaginitis: Secondary | ICD-10-CM | POA: Diagnosis not present

## 2015-06-19 DIAGNOSIS — N3642 Intrinsic sphincter deficiency (ISD): Secondary | ICD-10-CM | POA: Diagnosis not present

## 2015-06-19 DIAGNOSIS — N3946 Mixed incontinence: Secondary | ICD-10-CM | POA: Diagnosis not present

## 2015-06-19 DIAGNOSIS — Z01812 Encounter for preprocedural laboratory examination: Secondary | ICD-10-CM | POA: Diagnosis not present

## 2015-06-19 DIAGNOSIS — R350 Frequency of micturition: Secondary | ICD-10-CM | POA: Diagnosis not present

## 2015-06-24 DIAGNOSIS — N3946 Mixed incontinence: Secondary | ICD-10-CM | POA: Diagnosis not present

## 2015-06-24 DIAGNOSIS — N393 Stress incontinence (female) (male): Secondary | ICD-10-CM | POA: Diagnosis not present

## 2015-06-24 DIAGNOSIS — R350 Frequency of micturition: Secondary | ICD-10-CM | POA: Diagnosis not present

## 2015-06-24 DIAGNOSIS — N3642 Intrinsic sphincter deficiency (ISD): Secondary | ICD-10-CM | POA: Diagnosis not present

## 2015-06-24 DIAGNOSIS — Z01812 Encounter for preprocedural laboratory examination: Secondary | ICD-10-CM | POA: Diagnosis not present

## 2015-07-03 DIAGNOSIS — N390 Urinary tract infection, site not specified: Secondary | ICD-10-CM | POA: Diagnosis not present

## 2015-07-29 DIAGNOSIS — E018 Other iodine-deficiency related thyroid disorders and allied conditions: Secondary | ICD-10-CM | POA: Diagnosis not present

## 2015-07-29 DIAGNOSIS — E038 Other specified hypothyroidism: Secondary | ICD-10-CM | POA: Diagnosis not present

## 2015-07-29 DIAGNOSIS — E039 Hypothyroidism, unspecified: Secondary | ICD-10-CM | POA: Diagnosis not present

## 2015-08-05 DIAGNOSIS — E038 Other specified hypothyroidism: Secondary | ICD-10-CM | POA: Diagnosis not present

## 2015-08-05 DIAGNOSIS — Z1231 Encounter for screening mammogram for malignant neoplasm of breast: Secondary | ICD-10-CM | POA: Diagnosis not present

## 2015-08-05 DIAGNOSIS — Z6821 Body mass index (BMI) 21.0-21.9, adult: Secondary | ICD-10-CM | POA: Diagnosis not present

## 2015-08-05 DIAGNOSIS — Z23 Encounter for immunization: Secondary | ICD-10-CM | POA: Diagnosis not present

## 2015-08-05 DIAGNOSIS — Z0000 Encounter for general adult medical examination without abnormal findings: Secondary | ICD-10-CM | POA: Diagnosis not present

## 2015-08-30 DIAGNOSIS — Z1231 Encounter for screening mammogram for malignant neoplasm of breast: Secondary | ICD-10-CM | POA: Diagnosis not present

## 2015-09-16 DIAGNOSIS — M25562 Pain in left knee: Secondary | ICD-10-CM | POA: Diagnosis not present

## 2015-09-16 DIAGNOSIS — M170 Bilateral primary osteoarthritis of knee: Secondary | ICD-10-CM | POA: Diagnosis not present

## 2015-10-29 DIAGNOSIS — M150 Primary generalized (osteo)arthritis: Secondary | ICD-10-CM | POA: Diagnosis not present

## 2015-11-29 DIAGNOSIS — E039 Hypothyroidism, unspecified: Secondary | ICD-10-CM | POA: Diagnosis not present

## 2015-11-29 DIAGNOSIS — M179 Osteoarthritis of knee, unspecified: Secondary | ICD-10-CM | POA: Diagnosis not present

## 2015-11-29 DIAGNOSIS — R5383 Other fatigue: Secondary | ICD-10-CM | POA: Diagnosis not present

## 2015-11-29 DIAGNOSIS — J309 Allergic rhinitis, unspecified: Secondary | ICD-10-CM | POA: Diagnosis not present

## 2015-11-29 DIAGNOSIS — E559 Vitamin D deficiency, unspecified: Secondary | ICD-10-CM | POA: Diagnosis not present

## 2015-11-29 DIAGNOSIS — M810 Age-related osteoporosis without current pathological fracture: Secondary | ICD-10-CM | POA: Diagnosis not present

## 2015-12-02 DIAGNOSIS — C44119 Basal cell carcinoma of skin of left eyelid, including canthus: Secondary | ICD-10-CM | POA: Diagnosis not present

## 2015-12-02 DIAGNOSIS — D1801 Hemangioma of skin and subcutaneous tissue: Secondary | ICD-10-CM | POA: Diagnosis not present

## 2015-12-02 DIAGNOSIS — B354 Tinea corporis: Secondary | ICD-10-CM | POA: Diagnosis not present

## 2015-12-02 DIAGNOSIS — L300 Nummular dermatitis: Secondary | ICD-10-CM | POA: Diagnosis not present

## 2015-12-02 DIAGNOSIS — C44319 Basal cell carcinoma of skin of other parts of face: Secondary | ICD-10-CM | POA: Diagnosis not present

## 2015-12-02 DIAGNOSIS — R5383 Other fatigue: Secondary | ICD-10-CM | POA: Diagnosis not present

## 2015-12-31 DIAGNOSIS — D4959 Neoplasm of unspecified behavior of other genitourinary organ: Secondary | ICD-10-CM | POA: Diagnosis not present

## 2015-12-31 DIAGNOSIS — Z01818 Encounter for other preprocedural examination: Secondary | ICD-10-CM | POA: Diagnosis not present

## 2016-01-08 DIAGNOSIS — N9089 Other specified noninflammatory disorders of vulva and perineum: Secondary | ICD-10-CM | POA: Diagnosis not present

## 2016-01-08 DIAGNOSIS — D280 Benign neoplasm of vulva: Secondary | ICD-10-CM | POA: Diagnosis not present

## 2016-01-08 DIAGNOSIS — D4959 Neoplasm of unspecified behavior of other genitourinary organ: Secondary | ICD-10-CM | POA: Diagnosis not present

## 2016-01-27 DIAGNOSIS — E039 Hypothyroidism, unspecified: Secondary | ICD-10-CM | POA: Diagnosis not present

## 2016-01-27 DIAGNOSIS — E559 Vitamin D deficiency, unspecified: Secondary | ICD-10-CM | POA: Diagnosis not present

## 2016-01-31 DIAGNOSIS — M179 Osteoarthritis of knee, unspecified: Secondary | ICD-10-CM | POA: Diagnosis not present

## 2016-01-31 DIAGNOSIS — E559 Vitamin D deficiency, unspecified: Secondary | ICD-10-CM | POA: Diagnosis not present

## 2016-01-31 DIAGNOSIS — E039 Hypothyroidism, unspecified: Secondary | ICD-10-CM | POA: Diagnosis not present

## 2016-02-27 DIAGNOSIS — M150 Primary generalized (osteo)arthritis: Secondary | ICD-10-CM | POA: Diagnosis not present

## 2016-02-27 DIAGNOSIS — Z791 Long term (current) use of non-steroidal anti-inflammatories (NSAID): Secondary | ICD-10-CM | POA: Diagnosis not present

## 2016-03-22 DIAGNOSIS — J069 Acute upper respiratory infection, unspecified: Secondary | ICD-10-CM | POA: Diagnosis not present

## 2016-03-22 DIAGNOSIS — J029 Acute pharyngitis, unspecified: Secondary | ICD-10-CM | POA: Diagnosis not present

## 2016-03-31 DIAGNOSIS — R05 Cough: Secondary | ICD-10-CM | POA: Diagnosis not present

## 2016-05-28 DIAGNOSIS — M67231 Synovial hypertrophy, not elsewhere classified, right forearm: Secondary | ICD-10-CM | POA: Diagnosis not present

## 2016-07-09 DIAGNOSIS — Z23 Encounter for immunization: Secondary | ICD-10-CM | POA: Diagnosis not present

## 2016-07-09 DIAGNOSIS — E039 Hypothyroidism, unspecified: Secondary | ICD-10-CM | POA: Diagnosis not present

## 2016-07-09 DIAGNOSIS — M1711 Unilateral primary osteoarthritis, right knee: Secondary | ICD-10-CM | POA: Diagnosis not present

## 2016-07-09 DIAGNOSIS — H109 Unspecified conjunctivitis: Secondary | ICD-10-CM | POA: Diagnosis not present

## 2016-07-09 DIAGNOSIS — R634 Abnormal weight loss: Secondary | ICD-10-CM | POA: Diagnosis not present

## 2016-07-09 DIAGNOSIS — F411 Generalized anxiety disorder: Secondary | ICD-10-CM | POA: Diagnosis not present

## 2016-07-31 DIAGNOSIS — Z79899 Other long term (current) drug therapy: Secondary | ICD-10-CM | POA: Diagnosis not present

## 2016-07-31 DIAGNOSIS — E038 Other specified hypothyroidism: Secondary | ICD-10-CM | POA: Diagnosis not present

## 2016-08-19 DIAGNOSIS — M949 Disorder of cartilage, unspecified: Secondary | ICD-10-CM | POA: Diagnosis not present

## 2016-08-19 DIAGNOSIS — Z1231 Encounter for screening mammogram for malignant neoplasm of breast: Secondary | ICD-10-CM | POA: Diagnosis not present

## 2016-08-19 DIAGNOSIS — M899 Disorder of bone, unspecified: Secondary | ICD-10-CM | POA: Diagnosis not present

## 2016-08-19 DIAGNOSIS — Z0000 Encounter for general adult medical examination without abnormal findings: Secondary | ICD-10-CM | POA: Diagnosis not present

## 2016-08-19 DIAGNOSIS — E038 Other specified hypothyroidism: Secondary | ICD-10-CM | POA: Diagnosis not present

## 2016-09-08 DIAGNOSIS — B373 Candidiasis of vulva and vagina: Secondary | ICD-10-CM | POA: Diagnosis not present

## 2016-09-08 DIAGNOSIS — R300 Dysuria: Secondary | ICD-10-CM | POA: Diagnosis not present

## 2016-09-08 DIAGNOSIS — N390 Urinary tract infection, site not specified: Secondary | ICD-10-CM | POA: Diagnosis not present

## 2016-09-08 DIAGNOSIS — N761 Subacute and chronic vaginitis: Secondary | ICD-10-CM | POA: Diagnosis not present

## 2016-09-08 DIAGNOSIS — N952 Postmenopausal atrophic vaginitis: Secondary | ICD-10-CM | POA: Diagnosis not present

## 2016-09-15 DIAGNOSIS — R351 Nocturia: Secondary | ICD-10-CM | POA: Diagnosis not present

## 2016-09-15 DIAGNOSIS — R350 Frequency of micturition: Secondary | ICD-10-CM | POA: Diagnosis not present

## 2016-09-15 DIAGNOSIS — N393 Stress incontinence (female) (male): Secondary | ICD-10-CM | POA: Diagnosis not present

## 2016-09-16 DIAGNOSIS — E039 Hypothyroidism, unspecified: Secondary | ICD-10-CM | POA: Diagnosis not present

## 2016-09-16 DIAGNOSIS — Z136 Encounter for screening for cardiovascular disorders: Secondary | ICD-10-CM | POA: Diagnosis not present

## 2016-09-22 DIAGNOSIS — M179 Osteoarthritis of knee, unspecified: Secondary | ICD-10-CM | POA: Diagnosis not present

## 2016-09-22 DIAGNOSIS — F411 Generalized anxiety disorder: Secondary | ICD-10-CM | POA: Diagnosis not present

## 2016-09-22 DIAGNOSIS — Z136 Encounter for screening for cardiovascular disorders: Secondary | ICD-10-CM | POA: Diagnosis not present

## 2016-09-22 DIAGNOSIS — Z0000 Encounter for general adult medical examination without abnormal findings: Secondary | ICD-10-CM | POA: Diagnosis not present

## 2016-09-22 DIAGNOSIS — E039 Hypothyroidism, unspecified: Secondary | ICD-10-CM | POA: Diagnosis not present

## 2016-10-06 DIAGNOSIS — R42 Dizziness and giddiness: Secondary | ICD-10-CM | POA: Diagnosis not present

## 2016-10-06 DIAGNOSIS — J3489 Other specified disorders of nose and nasal sinuses: Secondary | ICD-10-CM | POA: Diagnosis not present

## 2016-10-07 DIAGNOSIS — R2689 Other abnormalities of gait and mobility: Secondary | ICD-10-CM | POA: Diagnosis not present

## 2016-10-07 DIAGNOSIS — M9904 Segmental and somatic dysfunction of sacral region: Secondary | ICD-10-CM | POA: Diagnosis not present

## 2016-10-07 DIAGNOSIS — M9901 Segmental and somatic dysfunction of cervical region: Secondary | ICD-10-CM | POA: Diagnosis not present

## 2016-10-07 DIAGNOSIS — M50322 Other cervical disc degeneration at C5-C6 level: Secondary | ICD-10-CM | POA: Diagnosis not present

## 2016-10-07 DIAGNOSIS — R42 Dizziness and giddiness: Secondary | ICD-10-CM | POA: Diagnosis not present

## 2016-10-07 DIAGNOSIS — M9903 Segmental and somatic dysfunction of lumbar region: Secondary | ICD-10-CM | POA: Diagnosis not present

## 2016-10-07 DIAGNOSIS — H903 Sensorineural hearing loss, bilateral: Secondary | ICD-10-CM | POA: Diagnosis not present

## 2016-10-08 DIAGNOSIS — M9903 Segmental and somatic dysfunction of lumbar region: Secondary | ICD-10-CM | POA: Diagnosis not present

## 2016-10-08 DIAGNOSIS — M9904 Segmental and somatic dysfunction of sacral region: Secondary | ICD-10-CM | POA: Diagnosis not present

## 2016-10-08 DIAGNOSIS — M50322 Other cervical disc degeneration at C5-C6 level: Secondary | ICD-10-CM | POA: Diagnosis not present

## 2016-10-08 DIAGNOSIS — M9901 Segmental and somatic dysfunction of cervical region: Secondary | ICD-10-CM | POA: Diagnosis not present

## 2016-10-09 DIAGNOSIS — M9904 Segmental and somatic dysfunction of sacral region: Secondary | ICD-10-CM | POA: Diagnosis not present

## 2016-10-09 DIAGNOSIS — M9901 Segmental and somatic dysfunction of cervical region: Secondary | ICD-10-CM | POA: Diagnosis not present

## 2016-10-09 DIAGNOSIS — M50322 Other cervical disc degeneration at C5-C6 level: Secondary | ICD-10-CM | POA: Diagnosis not present

## 2016-10-09 DIAGNOSIS — M9903 Segmental and somatic dysfunction of lumbar region: Secondary | ICD-10-CM | POA: Diagnosis not present

## 2016-10-12 DIAGNOSIS — M9903 Segmental and somatic dysfunction of lumbar region: Secondary | ICD-10-CM | POA: Diagnosis not present

## 2016-10-12 DIAGNOSIS — M50322 Other cervical disc degeneration at C5-C6 level: Secondary | ICD-10-CM | POA: Diagnosis not present

## 2016-10-12 DIAGNOSIS — M9904 Segmental and somatic dysfunction of sacral region: Secondary | ICD-10-CM | POA: Diagnosis not present

## 2016-10-12 DIAGNOSIS — M9901 Segmental and somatic dysfunction of cervical region: Secondary | ICD-10-CM | POA: Diagnosis not present

## 2016-10-13 DIAGNOSIS — H8110 Benign paroxysmal vertigo, unspecified ear: Secondary | ICD-10-CM | POA: Diagnosis not present

## 2016-10-13 DIAGNOSIS — H903 Sensorineural hearing loss, bilateral: Secondary | ICD-10-CM | POA: Diagnosis not present

## 2016-10-14 DIAGNOSIS — M50322 Other cervical disc degeneration at C5-C6 level: Secondary | ICD-10-CM | POA: Diagnosis not present

## 2016-10-14 DIAGNOSIS — M9904 Segmental and somatic dysfunction of sacral region: Secondary | ICD-10-CM | POA: Diagnosis not present

## 2016-10-14 DIAGNOSIS — M9903 Segmental and somatic dysfunction of lumbar region: Secondary | ICD-10-CM | POA: Diagnosis not present

## 2016-10-14 DIAGNOSIS — M9901 Segmental and somatic dysfunction of cervical region: Secondary | ICD-10-CM | POA: Diagnosis not present

## 2016-10-15 DIAGNOSIS — H8112 Benign paroxysmal vertigo, left ear: Secondary | ICD-10-CM | POA: Diagnosis not present

## 2016-10-16 DIAGNOSIS — M25561 Pain in right knee: Secondary | ICD-10-CM | POA: Diagnosis not present

## 2016-10-16 DIAGNOSIS — M1711 Unilateral primary osteoarthritis, right knee: Secondary | ICD-10-CM | POA: Diagnosis not present

## 2016-10-19 DIAGNOSIS — N761 Subacute and chronic vaginitis: Secondary | ICD-10-CM | POA: Diagnosis not present

## 2016-10-19 DIAGNOSIS — N952 Postmenopausal atrophic vaginitis: Secondary | ICD-10-CM | POA: Diagnosis not present

## 2016-10-19 DIAGNOSIS — H8112 Benign paroxysmal vertigo, left ear: Secondary | ICD-10-CM | POA: Diagnosis not present

## 2016-10-20 DIAGNOSIS — N761 Subacute and chronic vaginitis: Secondary | ICD-10-CM | POA: Diagnosis not present

## 2016-10-21 DIAGNOSIS — H8112 Benign paroxysmal vertigo, left ear: Secondary | ICD-10-CM | POA: Diagnosis not present

## 2016-11-12 DIAGNOSIS — R21 Rash and other nonspecific skin eruption: Secondary | ICD-10-CM | POA: Diagnosis not present

## 2016-11-16 DIAGNOSIS — Z791 Long term (current) use of non-steroidal anti-inflammatories (NSAID): Secondary | ICD-10-CM | POA: Diagnosis not present

## 2016-11-16 DIAGNOSIS — M150 Primary generalized (osteo)arthritis: Secondary | ICD-10-CM | POA: Diagnosis not present

## 2016-12-01 DIAGNOSIS — N952 Postmenopausal atrophic vaginitis: Secondary | ICD-10-CM | POA: Diagnosis not present

## 2016-12-01 DIAGNOSIS — N761 Subacute and chronic vaginitis: Secondary | ICD-10-CM | POA: Diagnosis not present

## 2017-03-23 DIAGNOSIS — N952 Postmenopausal atrophic vaginitis: Secondary | ICD-10-CM | POA: Diagnosis not present

## 2017-03-23 DIAGNOSIS — R350 Frequency of micturition: Secondary | ICD-10-CM | POA: Diagnosis not present

## 2017-03-23 DIAGNOSIS — N393 Stress incontinence (female) (male): Secondary | ICD-10-CM | POA: Diagnosis not present

## 2017-03-23 DIAGNOSIS — R351 Nocturia: Secondary | ICD-10-CM | POA: Diagnosis not present

## 2017-03-31 DIAGNOSIS — R351 Nocturia: Secondary | ICD-10-CM | POA: Diagnosis not present

## 2017-04-09 DIAGNOSIS — W548XXA Other contact with dog, initial encounter: Secondary | ICD-10-CM | POA: Diagnosis not present

## 2017-04-09 DIAGNOSIS — S51802A Unspecified open wound of left forearm, initial encounter: Secondary | ICD-10-CM | POA: Diagnosis not present

## 2017-04-09 DIAGNOSIS — Z6820 Body mass index (BMI) 20.0-20.9, adult: Secondary | ICD-10-CM | POA: Diagnosis not present

## 2017-04-09 DIAGNOSIS — T148XXA Other injury of unspecified body region, initial encounter: Secondary | ICD-10-CM | POA: Diagnosis not present

## 2017-04-09 DIAGNOSIS — Z23 Encounter for immunization: Secondary | ICD-10-CM | POA: Diagnosis not present

## 2017-04-28 DIAGNOSIS — H1033 Unspecified acute conjunctivitis, bilateral: Secondary | ICD-10-CM | POA: Diagnosis not present

## 2017-05-24 DIAGNOSIS — N952 Postmenopausal atrophic vaginitis: Secondary | ICD-10-CM | POA: Diagnosis not present

## 2017-05-24 DIAGNOSIS — R351 Nocturia: Secondary | ICD-10-CM | POA: Diagnosis not present

## 2017-05-24 DIAGNOSIS — R3915 Urgency of urination: Secondary | ICD-10-CM | POA: Diagnosis not present

## 2017-05-24 DIAGNOSIS — M949 Disorder of cartilage, unspecified: Secondary | ICD-10-CM | POA: Diagnosis not present

## 2017-05-24 DIAGNOSIS — Z79899 Other long term (current) drug therapy: Secondary | ICD-10-CM | POA: Diagnosis not present

## 2017-05-24 DIAGNOSIS — M899 Disorder of bone, unspecified: Secondary | ICD-10-CM | POA: Diagnosis not present

## 2017-05-24 DIAGNOSIS — E038 Other specified hypothyroidism: Secondary | ICD-10-CM | POA: Diagnosis not present

## 2017-06-01 DIAGNOSIS — E039 Hypothyroidism, unspecified: Secondary | ICD-10-CM | POA: Diagnosis not present

## 2017-06-10 DIAGNOSIS — Z23 Encounter for immunization: Secondary | ICD-10-CM | POA: Diagnosis not present

## 2017-07-19 DIAGNOSIS — Z791 Long term (current) use of non-steroidal anti-inflammatories (NSAID): Secondary | ICD-10-CM | POA: Diagnosis not present

## 2017-07-19 DIAGNOSIS — M150 Primary generalized (osteo)arthritis: Secondary | ICD-10-CM | POA: Diagnosis not present
